# Patient Record
Sex: Female | Born: 1940 | Race: White | Hispanic: No | Smoking: Former smoker
Health system: Southern US, Community
[De-identification: ages and names within clinical notes are randomized; demographics above are authoritative.]

## PROBLEM LIST (undated history)

## (undated) DIAGNOSIS — H269 Unspecified cataract: Secondary | ICD-10-CM

## (undated) DIAGNOSIS — E079 Disorder of thyroid, unspecified: Secondary | ICD-10-CM

## (undated) DIAGNOSIS — T7840XA Allergy, unspecified, initial encounter: Secondary | ICD-10-CM

## (undated) DIAGNOSIS — N390 Urinary tract infection, site not specified: Secondary | ICD-10-CM

## (undated) DIAGNOSIS — M1990 Unspecified osteoarthritis, unspecified site: Secondary | ICD-10-CM

## (undated) DIAGNOSIS — I10 Essential (primary) hypertension: Secondary | ICD-10-CM

## (undated) DIAGNOSIS — K589 Irritable bowel syndrome without diarrhea: Secondary | ICD-10-CM

## (undated) DIAGNOSIS — H409 Unspecified glaucoma: Secondary | ICD-10-CM

## (undated) HISTORY — DX: Unspecified cataract: H26.9

## (undated) HISTORY — PX: OTHER SURGICAL HISTORY: SHX169

## (undated) HISTORY — DX: Urinary tract infection, site not specified: N39.0

## (undated) HISTORY — DX: Essential (primary) hypertension: I10

## (undated) HISTORY — DX: Disorder of thyroid, unspecified: E07.9

## (undated) HISTORY — DX: Unspecified osteoarthritis, unspecified site: M19.90

## (undated) HISTORY — DX: Unspecified glaucoma: H40.9

## (undated) HISTORY — DX: Allergy, unspecified, initial encounter: T78.40XA

## (undated) HISTORY — PX: TONSILLECTOMY AND ADENOIDECTOMY: SUR1326

## (undated) HISTORY — DX: Irritable bowel syndrome, unspecified: K58.9

---

## 1978-05-20 HISTORY — PX: HERNIA REPAIR: SHX51

## 1979-05-21 HISTORY — PX: APPENDECTOMY: SHX54

## 1979-05-21 HISTORY — PX: ABDOMINAL HYSTERECTOMY: SHX81

## 2015-06-10 DIAGNOSIS — R7989 Other specified abnormal findings of blood chemistry: Secondary | ICD-10-CM | POA: Diagnosis not present

## 2015-06-10 DIAGNOSIS — I10 Essential (primary) hypertension: Secondary | ICD-10-CM | POA: Diagnosis not present

## 2015-06-10 DIAGNOSIS — E034 Atrophy of thyroid (acquired): Secondary | ICD-10-CM | POA: Diagnosis not present

## 2015-06-10 DIAGNOSIS — K8689 Other specified diseases of pancreas: Secondary | ICD-10-CM | POA: Diagnosis not present

## 2015-06-10 DIAGNOSIS — I7300 Raynaud's syndrome without gangrene: Secondary | ICD-10-CM | POA: Diagnosis not present

## 2015-06-10 DIAGNOSIS — E7800 Pure hypercholesterolemia, unspecified: Secondary | ICD-10-CM | POA: Diagnosis not present

## 2015-06-16 DIAGNOSIS — L651 Anagen effluvium: Secondary | ICD-10-CM | POA: Diagnosis not present

## 2015-06-16 DIAGNOSIS — I7300 Raynaud's syndrome without gangrene: Secondary | ICD-10-CM | POA: Diagnosis not present

## 2015-06-16 DIAGNOSIS — J320 Chronic maxillary sinusitis: Secondary | ICD-10-CM | POA: Diagnosis not present

## 2015-06-16 DIAGNOSIS — E034 Atrophy of thyroid (acquired): Secondary | ICD-10-CM | POA: Diagnosis not present

## 2015-06-16 DIAGNOSIS — K8681 Exocrine pancreatic insufficiency: Secondary | ICD-10-CM | POA: Diagnosis not present

## 2015-06-16 DIAGNOSIS — I878 Other specified disorders of veins: Secondary | ICD-10-CM | POA: Diagnosis not present

## 2015-06-16 DIAGNOSIS — E7800 Pure hypercholesterolemia, unspecified: Secondary | ICD-10-CM | POA: Diagnosis not present

## 2015-06-16 DIAGNOSIS — I10 Essential (primary) hypertension: Secondary | ICD-10-CM | POA: Diagnosis not present

## 2015-06-16 DIAGNOSIS — R7989 Other specified abnormal findings of blood chemistry: Secondary | ICD-10-CM | POA: Diagnosis not present

## 2015-06-19 DIAGNOSIS — J320 Chronic maxillary sinusitis: Secondary | ICD-10-CM | POA: Diagnosis not present

## 2015-06-23 DIAGNOSIS — J3489 Other specified disorders of nose and nasal sinuses: Secondary | ICD-10-CM | POA: Diagnosis not present

## 2015-06-23 DIAGNOSIS — K089 Disorder of teeth and supporting structures, unspecified: Secondary | ICD-10-CM | POA: Diagnosis not present

## 2015-06-23 DIAGNOSIS — J320 Chronic maxillary sinusitis: Secondary | ICD-10-CM | POA: Diagnosis not present

## 2015-07-11 DIAGNOSIS — J320 Chronic maxillary sinusitis: Secondary | ICD-10-CM | POA: Diagnosis not present

## 2015-08-25 DIAGNOSIS — H401132 Primary open-angle glaucoma, bilateral, moderate stage: Secondary | ICD-10-CM | POA: Diagnosis not present

## 2015-09-04 DIAGNOSIS — M79672 Pain in left foot: Secondary | ICD-10-CM | POA: Diagnosis not present

## 2015-09-04 DIAGNOSIS — M79671 Pain in right foot: Secondary | ICD-10-CM | POA: Diagnosis not present

## 2015-09-04 DIAGNOSIS — L852 Keratosis punctata (palmaris et plantaris): Secondary | ICD-10-CM | POA: Diagnosis not present

## 2015-10-05 DIAGNOSIS — E034 Atrophy of thyroid (acquired): Secondary | ICD-10-CM | POA: Diagnosis not present

## 2015-10-05 DIAGNOSIS — L651 Anagen effluvium: Secondary | ICD-10-CM | POA: Diagnosis not present

## 2015-10-05 DIAGNOSIS — L659 Nonscarring hair loss, unspecified: Secondary | ICD-10-CM | POA: Diagnosis not present

## 2015-10-05 DIAGNOSIS — K8681 Exocrine pancreatic insufficiency: Secondary | ICD-10-CM | POA: Diagnosis not present

## 2015-10-05 DIAGNOSIS — I7300 Raynaud's syndrome without gangrene: Secondary | ICD-10-CM | POA: Diagnosis not present

## 2015-10-05 DIAGNOSIS — R682 Dry mouth, unspecified: Secondary | ICD-10-CM | POA: Diagnosis not present

## 2015-10-05 DIAGNOSIS — R768 Other specified abnormal immunological findings in serum: Secondary | ICD-10-CM | POA: Diagnosis not present

## 2015-10-05 DIAGNOSIS — E7800 Pure hypercholesterolemia, unspecified: Secondary | ICD-10-CM | POA: Diagnosis not present

## 2015-10-05 DIAGNOSIS — R7989 Other specified abnormal findings of blood chemistry: Secondary | ICD-10-CM | POA: Diagnosis not present

## 2015-10-05 DIAGNOSIS — I10 Essential (primary) hypertension: Secondary | ICD-10-CM | POA: Diagnosis not present

## 2015-10-05 DIAGNOSIS — I878 Other specified disorders of veins: Secondary | ICD-10-CM | POA: Diagnosis not present

## 2015-10-25 DIAGNOSIS — I7300 Raynaud's syndrome without gangrene: Secondary | ICD-10-CM | POA: Diagnosis not present

## 2015-10-25 DIAGNOSIS — E034 Atrophy of thyroid (acquired): Secondary | ICD-10-CM | POA: Diagnosis not present

## 2015-10-25 DIAGNOSIS — I10 Essential (primary) hypertension: Secondary | ICD-10-CM | POA: Diagnosis not present

## 2015-10-25 DIAGNOSIS — E7800 Pure hypercholesterolemia, unspecified: Secondary | ICD-10-CM | POA: Diagnosis not present

## 2015-10-25 DIAGNOSIS — L651 Anagen effluvium: Secondary | ICD-10-CM | POA: Diagnosis not present

## 2015-10-25 DIAGNOSIS — R682 Dry mouth, unspecified: Secondary | ICD-10-CM | POA: Diagnosis not present

## 2015-10-25 DIAGNOSIS — E559 Vitamin D deficiency, unspecified: Secondary | ICD-10-CM | POA: Diagnosis not present

## 2015-10-25 DIAGNOSIS — R768 Other specified abnormal immunological findings in serum: Secondary | ICD-10-CM | POA: Diagnosis not present

## 2015-12-01 DIAGNOSIS — H401132 Primary open-angle glaucoma, bilateral, moderate stage: Secondary | ICD-10-CM | POA: Diagnosis not present

## 2015-12-11 DIAGNOSIS — B373 Candidiasis of vulva and vagina: Secondary | ICD-10-CM | POA: Diagnosis not present

## 2015-12-11 DIAGNOSIS — N952 Postmenopausal atrophic vaginitis: Secondary | ICD-10-CM | POA: Diagnosis not present

## 2015-12-11 DIAGNOSIS — I10 Essential (primary) hypertension: Secondary | ICD-10-CM | POA: Diagnosis not present

## 2015-12-25 DIAGNOSIS — N952 Postmenopausal atrophic vaginitis: Secondary | ICD-10-CM | POA: Diagnosis not present

## 2015-12-25 DIAGNOSIS — M79671 Pain in right foot: Secondary | ICD-10-CM | POA: Diagnosis not present

## 2015-12-25 DIAGNOSIS — M79672 Pain in left foot: Secondary | ICD-10-CM | POA: Diagnosis not present

## 2015-12-25 DIAGNOSIS — L852 Keratosis punctata (palmaris et plantaris): Secondary | ICD-10-CM | POA: Diagnosis not present

## 2016-01-19 DIAGNOSIS — H401132 Primary open-angle glaucoma, bilateral, moderate stage: Secondary | ICD-10-CM | POA: Diagnosis not present

## 2016-01-19 DIAGNOSIS — H4322 Crystalline deposits in vitreous body, left eye: Secondary | ICD-10-CM | POA: Diagnosis not present

## 2016-02-27 DIAGNOSIS — E559 Vitamin D deficiency, unspecified: Secondary | ICD-10-CM | POA: Diagnosis not present

## 2016-02-27 DIAGNOSIS — R682 Dry mouth, unspecified: Secondary | ICD-10-CM | POA: Diagnosis not present

## 2016-02-27 DIAGNOSIS — E034 Atrophy of thyroid (acquired): Secondary | ICD-10-CM | POA: Diagnosis not present

## 2016-02-27 DIAGNOSIS — I10 Essential (primary) hypertension: Secondary | ICD-10-CM | POA: Diagnosis not present

## 2016-02-27 DIAGNOSIS — L651 Anagen effluvium: Secondary | ICD-10-CM | POA: Diagnosis not present

## 2016-02-27 DIAGNOSIS — E7800 Pure hypercholesterolemia, unspecified: Secondary | ICD-10-CM | POA: Diagnosis not present

## 2016-02-27 DIAGNOSIS — I7300 Raynaud's syndrome without gangrene: Secondary | ICD-10-CM | POA: Diagnosis not present

## 2016-02-27 DIAGNOSIS — R768 Other specified abnormal immunological findings in serum: Secondary | ICD-10-CM | POA: Diagnosis not present

## 2016-02-29 DIAGNOSIS — E559 Vitamin D deficiency, unspecified: Secondary | ICD-10-CM | POA: Diagnosis not present

## 2016-02-29 DIAGNOSIS — R768 Other specified abnormal immunological findings in serum: Secondary | ICD-10-CM | POA: Diagnosis not present

## 2016-02-29 DIAGNOSIS — I10 Essential (primary) hypertension: Secondary | ICD-10-CM | POA: Diagnosis not present

## 2016-02-29 DIAGNOSIS — K8681 Exocrine pancreatic insufficiency: Secondary | ICD-10-CM | POA: Diagnosis not present

## 2016-02-29 DIAGNOSIS — E7800 Pure hypercholesterolemia, unspecified: Secondary | ICD-10-CM | POA: Diagnosis not present

## 2016-02-29 DIAGNOSIS — M180 Bilateral primary osteoarthritis of first carpometacarpal joints: Secondary | ICD-10-CM | POA: Diagnosis not present

## 2016-02-29 DIAGNOSIS — E034 Atrophy of thyroid (acquired): Secondary | ICD-10-CM | POA: Diagnosis not present

## 2016-02-29 DIAGNOSIS — Z23 Encounter for immunization: Secondary | ICD-10-CM | POA: Diagnosis not present

## 2016-02-29 DIAGNOSIS — R682 Dry mouth, unspecified: Secondary | ICD-10-CM | POA: Diagnosis not present

## 2016-02-29 DIAGNOSIS — L659 Nonscarring hair loss, unspecified: Secondary | ICD-10-CM | POA: Diagnosis not present

## 2016-02-29 DIAGNOSIS — I7300 Raynaud's syndrome without gangrene: Secondary | ICD-10-CM | POA: Diagnosis not present

## 2016-02-29 DIAGNOSIS — I878 Other specified disorders of veins: Secondary | ICD-10-CM | POA: Diagnosis not present

## 2016-03-15 DIAGNOSIS — R8299 Other abnormal findings in urine: Secondary | ICD-10-CM | POA: Diagnosis not present

## 2016-03-15 DIAGNOSIS — S30824D Blister (nonthermal) of vagina and vulva, subsequent encounter: Secondary | ICD-10-CM | POA: Diagnosis not present

## 2016-03-21 DIAGNOSIS — Z1231 Encounter for screening mammogram for malignant neoplasm of breast: Secondary | ICD-10-CM | POA: Diagnosis not present

## 2016-05-24 DIAGNOSIS — H401132 Primary open-angle glaucoma, bilateral, moderate stage: Secondary | ICD-10-CM | POA: Diagnosis not present

## 2016-05-27 DIAGNOSIS — L03039 Cellulitis of unspecified toe: Secondary | ICD-10-CM | POA: Diagnosis not present

## 2016-05-27 DIAGNOSIS — L600 Ingrowing nail: Secondary | ICD-10-CM | POA: Diagnosis not present

## 2016-05-27 DIAGNOSIS — M79672 Pain in left foot: Secondary | ICD-10-CM | POA: Diagnosis not present

## 2016-05-27 DIAGNOSIS — M79671 Pain in right foot: Secondary | ICD-10-CM | POA: Diagnosis not present

## 2016-06-24 DIAGNOSIS — J0101 Acute recurrent maxillary sinusitis: Secondary | ICD-10-CM | POA: Diagnosis not present

## 2016-07-01 DIAGNOSIS — I878 Other specified disorders of veins: Secondary | ICD-10-CM | POA: Diagnosis not present

## 2016-07-01 DIAGNOSIS — R768 Other specified abnormal immunological findings in serum: Secondary | ICD-10-CM | POA: Diagnosis not present

## 2016-07-01 DIAGNOSIS — K8681 Exocrine pancreatic insufficiency: Secondary | ICD-10-CM | POA: Diagnosis not present

## 2016-07-01 DIAGNOSIS — I10 Essential (primary) hypertension: Secondary | ICD-10-CM | POA: Diagnosis not present

## 2016-07-01 DIAGNOSIS — L659 Nonscarring hair loss, unspecified: Secondary | ICD-10-CM | POA: Diagnosis not present

## 2016-07-01 DIAGNOSIS — M180 Bilateral primary osteoarthritis of first carpometacarpal joints: Secondary | ICD-10-CM | POA: Diagnosis not present

## 2016-07-01 DIAGNOSIS — E7800 Pure hypercholesterolemia, unspecified: Secondary | ICD-10-CM | POA: Diagnosis not present

## 2016-07-01 DIAGNOSIS — R682 Dry mouth, unspecified: Secondary | ICD-10-CM | POA: Diagnosis not present

## 2016-07-01 DIAGNOSIS — E034 Atrophy of thyroid (acquired): Secondary | ICD-10-CM | POA: Diagnosis not present

## 2016-07-01 DIAGNOSIS — I7300 Raynaud's syndrome without gangrene: Secondary | ICD-10-CM | POA: Diagnosis not present

## 2016-07-04 DIAGNOSIS — I10 Essential (primary) hypertension: Secondary | ICD-10-CM | POA: Diagnosis not present

## 2016-07-04 DIAGNOSIS — E034 Atrophy of thyroid (acquired): Secondary | ICD-10-CM | POA: Diagnosis not present

## 2016-07-04 DIAGNOSIS — L659 Nonscarring hair loss, unspecified: Secondary | ICD-10-CM | POA: Diagnosis not present

## 2016-07-04 DIAGNOSIS — M19049 Primary osteoarthritis, unspecified hand: Secondary | ICD-10-CM | POA: Diagnosis not present

## 2016-07-04 DIAGNOSIS — E559 Vitamin D deficiency, unspecified: Secondary | ICD-10-CM | POA: Diagnosis not present

## 2016-07-04 DIAGNOSIS — J301 Allergic rhinitis due to pollen: Secondary | ICD-10-CM | POA: Diagnosis not present

## 2016-07-04 DIAGNOSIS — R768 Other specified abnormal immunological findings in serum: Secondary | ICD-10-CM | POA: Diagnosis not present

## 2016-07-04 DIAGNOSIS — E7800 Pure hypercholesterolemia, unspecified: Secondary | ICD-10-CM | POA: Diagnosis not present

## 2016-07-04 DIAGNOSIS — I7300 Raynaud's syndrome without gangrene: Secondary | ICD-10-CM | POA: Diagnosis not present

## 2016-07-04 DIAGNOSIS — I878 Other specified disorders of veins: Secondary | ICD-10-CM | POA: Diagnosis not present

## 2016-07-11 DIAGNOSIS — J320 Chronic maxillary sinusitis: Secondary | ICD-10-CM | POA: Diagnosis not present

## 2016-07-24 DIAGNOSIS — L569 Acute skin change due to ultraviolet radiation, unspecified: Secondary | ICD-10-CM | POA: Diagnosis not present

## 2016-07-24 DIAGNOSIS — L570 Actinic keratosis: Secondary | ICD-10-CM | POA: Diagnosis not present

## 2016-07-24 DIAGNOSIS — L259 Unspecified contact dermatitis, unspecified cause: Secondary | ICD-10-CM | POA: Diagnosis not present

## 2016-08-08 DIAGNOSIS — J320 Chronic maxillary sinusitis: Secondary | ICD-10-CM | POA: Diagnosis not present

## 2016-09-20 DIAGNOSIS — H401132 Primary open-angle glaucoma, bilateral, moderate stage: Secondary | ICD-10-CM | POA: Diagnosis not present

## 2016-09-30 DIAGNOSIS — L03039 Cellulitis of unspecified toe: Secondary | ICD-10-CM | POA: Diagnosis not present

## 2016-09-30 DIAGNOSIS — L600 Ingrowing nail: Secondary | ICD-10-CM | POA: Diagnosis not present

## 2016-09-30 DIAGNOSIS — M79671 Pain in right foot: Secondary | ICD-10-CM | POA: Diagnosis not present

## 2016-09-30 DIAGNOSIS — M79672 Pain in left foot: Secondary | ICD-10-CM | POA: Diagnosis not present

## 2016-10-23 DIAGNOSIS — M47812 Spondylosis without myelopathy or radiculopathy, cervical region: Secondary | ICD-10-CM | POA: Diagnosis not present

## 2016-10-23 DIAGNOSIS — M25512 Pain in left shoulder: Secondary | ICD-10-CM | POA: Diagnosis not present

## 2016-10-23 DIAGNOSIS — M19011 Primary osteoarthritis, right shoulder: Secondary | ICD-10-CM | POA: Diagnosis not present

## 2016-10-23 DIAGNOSIS — M479 Spondylosis, unspecified: Secondary | ICD-10-CM | POA: Diagnosis not present

## 2016-10-23 DIAGNOSIS — R768 Other specified abnormal immunological findings in serum: Secondary | ICD-10-CM | POA: Diagnosis not present

## 2016-10-23 DIAGNOSIS — M5412 Radiculopathy, cervical region: Secondary | ICD-10-CM | POA: Diagnosis not present

## 2016-10-23 DIAGNOSIS — M2578 Osteophyte, vertebrae: Secondary | ICD-10-CM | POA: Diagnosis not present

## 2016-10-23 DIAGNOSIS — M25511 Pain in right shoulder: Secondary | ICD-10-CM | POA: Diagnosis not present

## 2016-10-23 DIAGNOSIS — M19012 Primary osteoarthritis, left shoulder: Secondary | ICD-10-CM | POA: Diagnosis not present

## 2016-10-28 DIAGNOSIS — J301 Allergic rhinitis due to pollen: Secondary | ICD-10-CM | POA: Diagnosis not present

## 2016-10-28 DIAGNOSIS — L659 Nonscarring hair loss, unspecified: Secondary | ICD-10-CM | POA: Diagnosis not present

## 2016-10-28 DIAGNOSIS — I7300 Raynaud's syndrome without gangrene: Secondary | ICD-10-CM | POA: Diagnosis not present

## 2016-10-28 DIAGNOSIS — I878 Other specified disorders of veins: Secondary | ICD-10-CM | POA: Diagnosis not present

## 2016-10-28 DIAGNOSIS — E7800 Pure hypercholesterolemia, unspecified: Secondary | ICD-10-CM | POA: Diagnosis not present

## 2016-10-28 DIAGNOSIS — E559 Vitamin D deficiency, unspecified: Secondary | ICD-10-CM | POA: Diagnosis not present

## 2016-10-28 DIAGNOSIS — I10 Essential (primary) hypertension: Secondary | ICD-10-CM | POA: Diagnosis not present

## 2016-10-28 DIAGNOSIS — E034 Atrophy of thyroid (acquired): Secondary | ICD-10-CM | POA: Diagnosis not present

## 2016-10-28 DIAGNOSIS — R768 Other specified abnormal immunological findings in serum: Secondary | ICD-10-CM | POA: Diagnosis not present

## 2016-10-28 DIAGNOSIS — M19049 Primary osteoarthritis, unspecified hand: Secondary | ICD-10-CM | POA: Diagnosis not present

## 2016-10-31 DIAGNOSIS — I10 Essential (primary) hypertension: Secondary | ICD-10-CM | POA: Diagnosis not present

## 2016-10-31 DIAGNOSIS — L659 Nonscarring hair loss, unspecified: Secondary | ICD-10-CM | POA: Diagnosis not present

## 2016-10-31 DIAGNOSIS — E034 Atrophy of thyroid (acquired): Secondary | ICD-10-CM | POA: Diagnosis not present

## 2016-10-31 DIAGNOSIS — M1812 Unilateral primary osteoarthritis of first carpometacarpal joint, left hand: Secondary | ICD-10-CM | POA: Diagnosis not present

## 2016-10-31 DIAGNOSIS — M5412 Radiculopathy, cervical region: Secondary | ICD-10-CM | POA: Diagnosis not present

## 2016-10-31 DIAGNOSIS — E559 Vitamin D deficiency, unspecified: Secondary | ICD-10-CM | POA: Diagnosis not present

## 2016-10-31 DIAGNOSIS — M19049 Primary osteoarthritis, unspecified hand: Secondary | ICD-10-CM | POA: Diagnosis not present

## 2016-10-31 DIAGNOSIS — E7800 Pure hypercholesterolemia, unspecified: Secondary | ICD-10-CM | POA: Diagnosis not present

## 2016-10-31 DIAGNOSIS — R768 Other specified abnormal immunological findings in serum: Secondary | ICD-10-CM | POA: Diagnosis not present

## 2016-10-31 DIAGNOSIS — I7300 Raynaud's syndrome without gangrene: Secondary | ICD-10-CM | POA: Diagnosis not present

## 2016-10-31 DIAGNOSIS — M7541 Impingement syndrome of right shoulder: Secondary | ICD-10-CM | POA: Diagnosis not present

## 2016-10-31 DIAGNOSIS — M1811 Unilateral primary osteoarthritis of first carpometacarpal joint, right hand: Secondary | ICD-10-CM | POA: Diagnosis not present

## 2016-11-06 DIAGNOSIS — M5412 Radiculopathy, cervical region: Secondary | ICD-10-CM | POA: Diagnosis not present

## 2016-11-07 DIAGNOSIS — M180 Bilateral primary osteoarthritis of first carpometacarpal joints: Secondary | ICD-10-CM | POA: Diagnosis not present

## 2016-11-07 DIAGNOSIS — M5412 Radiculopathy, cervical region: Secondary | ICD-10-CM | POA: Diagnosis not present

## 2016-11-07 DIAGNOSIS — M25511 Pain in right shoulder: Secondary | ICD-10-CM | POA: Diagnosis not present

## 2016-11-08 DIAGNOSIS — H401132 Primary open-angle glaucoma, bilateral, moderate stage: Secondary | ICD-10-CM | POA: Diagnosis not present

## 2016-11-26 DIAGNOSIS — L600 Ingrowing nail: Secondary | ICD-10-CM | POA: Diagnosis not present

## 2016-11-26 DIAGNOSIS — L03039 Cellulitis of unspecified toe: Secondary | ICD-10-CM | POA: Diagnosis not present

## 2016-11-26 DIAGNOSIS — M79672 Pain in left foot: Secondary | ICD-10-CM | POA: Diagnosis not present

## 2016-11-26 DIAGNOSIS — M79671 Pain in right foot: Secondary | ICD-10-CM | POA: Diagnosis not present

## 2016-12-31 ENCOUNTER — Encounter: Payer: Self-pay | Admitting: Internal Medicine

## 2016-12-31 ENCOUNTER — Ambulatory Visit (INDEPENDENT_AMBULATORY_CARE_PROVIDER_SITE_OTHER): Payer: Medicare Other | Admitting: Internal Medicine

## 2016-12-31 DIAGNOSIS — M159 Polyosteoarthritis, unspecified: Secondary | ICD-10-CM

## 2016-12-31 DIAGNOSIS — H409 Unspecified glaucoma: Secondary | ICD-10-CM | POA: Diagnosis not present

## 2016-12-31 DIAGNOSIS — I10 Essential (primary) hypertension: Secondary | ICD-10-CM | POA: Diagnosis not present

## 2016-12-31 DIAGNOSIS — E039 Hypothyroidism, unspecified: Secondary | ICD-10-CM | POA: Diagnosis not present

## 2016-12-31 DIAGNOSIS — L659 Nonscarring hair loss, unspecified: Secondary | ICD-10-CM | POA: Diagnosis not present

## 2016-12-31 DIAGNOSIS — M150 Primary generalized (osteo)arthritis: Secondary | ICD-10-CM | POA: Diagnosis not present

## 2016-12-31 MED ORDER — SPIRONOLACTONE 25 MG PO TABS: 50.0000 mg | ORAL_TABLET | Freq: Two times a day (BID) | ORAL | 0 refills | Status: DC

## 2016-12-31 NOTE — Progress Notes (Signed)
HPI  Pt presents to the clinic today to establish care and for management of the conditions listed below. She is [place] care from [facility] [place].  Arthritis: She reports this is all over but worse in her shoulders and her hands. She recently had a steroid injection in her left shoulder. She takes Tylenol Arthritis as needed with good relief.  Glaucoma: She will be establishing with an eye doctor in September. She takes Timolol as prescribed.  Hair Loss: Controlled with Spironolactone. She is requesting a refill of this today.  HTN: Her BP today is 124/78. She is taking Amlodipine as prescribed. She denies swelling in her lower extremities. There is no ECG on file.  Hypothyroidism: She denies any issues on her current dose of Synthroid.   Flu: 01/2016 Tetanus: unsure Pneumovax: unsure Prevnar: unsure Zostovax: unsure Shingrix: never Mammogram: 02/2016 Pap Smear: Total Hysterectomy Bone Density: unsure Colon Screening: < 10 years ago Vision Screening: every 4 months Dentist: annually   Past Medical History:  Diagnosis Date  . Arthritis   . Glaucoma   . Hypertension   . Thyroid disease     Current Outpatient Prescriptions  Medication Sig Dispense Refill  . amLODipine (NORVASC) 10 MG tablet Take 10 mg by mouth daily.  0  . clobetasol cream (TEMOVATE) 0.05 % Apply 1 application topically 2 (two) times daily.    . fluticasone (FLONASE) 50 MCG/ACT nasal spray Place 2 sprays into both nostrils daily.    Marland Kitchen. latanoprost (XALATAN) 0.005 % ophthalmic solution Place 1 drop into both eyes at bedtime.    Marland Kitchen. levothyroxine (SYNTHROID, LEVOTHROID) 125 MCG tablet Take 62.5 mcg by mouth daily before breakfast.     . spironolactone (ALDACTONE) 25 MG tablet Take 50 mg by mouth 2 (two) times daily.    . timolol (TIMOPTIC) 0.5 % ophthalmic solution INSTILL 1 DROP INTO BOTH EYES EVERY DAY  6  . Vitamin D, Ergocalciferol, (DRISDOL) 50000 units CAPS capsule Take 50,000 Units by  mouth every 7 (seven) days.     No current facility-administered medications for this visit.     Not on File  Family History  Problem Relation Age of Onset  . Arthritis Mother   . Breast cancer Mother   . Stroke Mother   . Hypertension Mother   . Hypertension Father   . Diabetes Daughter   . Diabetes Son   . Arthritis Maternal Grandmother   . Arthritis Maternal Grandfather     Social History   Social History  . Marital status: Single    Spouse name: N/A  . Number of children: N/A  . Years of education: N/A   Occupational History  . Not on file.   Social History Main Topics  . Smoking status: Former Games developermoker  . Smokeless tobacco: Never Used     Comment: quit 25 years ago  . Alcohol use 4.2 oz/week    7 Glasses of wine per week     Comment: with dinner  . Drug use: Unknown  . Sexual activity: Not on file   Other Topics Concern  . Not on file   Social History Narrative  . No narrative on file    ROS:  Constitutional: Denies fever, malaise, fatigue, headache or abrupt weight changes.  HEENT: Denies eye pain, eye redness, ear pain, ringing in the ears, wax buildup, runny nose, nasal congestion, bloody nose, or sore throat. Respiratory: Denies difficulty breathing, shortness of breath, cough or sputum production.   Cardiovascular: Denies chest  pain, chest tightness, palpitations or swelling in the hands or feet.  Gastrointestinal: Denies abdominal pain, bloating, constipation, diarrhea or blood in the stool.  GU: Denies frequency, urgency, pain with urination, blood in urine, odor or discharge. Musculoskeletal: Pt reports joint pains. Denies decrease in range of motion, difficulty with gait, muscle pain or joint swelling.  Skin: Denies redness, rashes, lesions or ulcercations.  Neurological: Denies dizziness, difficulty with memory, difficulty with speech or problems with balance and coordination.  Psych: Denies anxiety, depression, SI/HI.  No other specific  complaints in a complete review of systems (except as listed in HPI above).  PE:  BP 124/78   Pulse 63   Temp 98.1 F (36.7 C) (Oral)   Ht 4' 11.25" (1.505 m)   Wt 119 lb (54 kg)   SpO2 96%   BMI 23.83 kg/m  Wt Readings from Last 3 Encounters:  12/31/16 119 lb (54 kg)    General: Appears her stated age, well developed, well nourished in NAD. Skin: Dry and intact. Neck: Neck supple, trachea midline. No masses, lumps present.  Cardiovascular: Normal rate and rhythm.  Pulmonary/Chest: Normal effort and positive vesicular breath sounds. No respiratory distress. No wheezes, rales or ronchi noted.  Musculoskeletal: Joint deformity noted in hands. No difficulty with gait.  Neurological: Alert and oriented.  Psychiatric: Mood and affect normal. Behavior is normal. Judgment and thought content normal.     Assessment and Plan:

## 2017-01-01 DIAGNOSIS — E039 Hypothyroidism, unspecified: Secondary | ICD-10-CM | POA: Insufficient documentation

## 2017-01-01 DIAGNOSIS — I10 Essential (primary) hypertension: Secondary | ICD-10-CM | POA: Insufficient documentation

## 2017-01-01 DIAGNOSIS — L659 Nonscarring hair loss, unspecified: Secondary | ICD-10-CM | POA: Insufficient documentation

## 2017-01-01 DIAGNOSIS — M1990 Unspecified osteoarthritis, unspecified site: Secondary | ICD-10-CM | POA: Insufficient documentation

## 2017-01-01 DIAGNOSIS — H409 Unspecified glaucoma: Secondary | ICD-10-CM | POA: Insufficient documentation

## 2017-01-01 NOTE — Assessment & Plan Note (Signed)
She will continue Tylenol Arthritis for now She will let me know if she wants a referral to ortho in this area

## 2017-01-01 NOTE — Assessment & Plan Note (Signed)
Continue Timolol She is establishing with opthalmology in 1 month.

## 2017-01-01 NOTE — Patient Instructions (Signed)
Alopecia Areata, Adult  Alopecia areata is a condition that causes you to lose hair. You may lose hair on your scalp in patches. In some cases, you may lose all the hair on your scalp (alopecia totalis) or all the hair from your face and body (alopecia universalis).  Alopecia areata is an autoimmune disease. This means that your body's defense system (immune system) mistakes normal parts of the body for germs or other things that can make you sick. When you have alopecia areata, the immune system attacks the hair follicles.  Alopecia areata usually develops in childhood, but it can develop at any age. For some people, their hair grows back on its own and hair loss does not happen again. For others, their hair may fall out and grow back in cycles. The hair loss may last many years. Having this condition can be emotionally difficult, but it is not dangerous.  What are the causes?  The cause of this condition is not known.  What increases the risk?  This condition is more likely to develop in people who have:   A family history of alopecia.   A family history of another autoimmune disease, including type 1 diabetes and rheumatoid arthritis.   Asthma and allergies.   Down syndrome.    What are the signs or symptoms?  Round spots of patchy hair loss on the scalp is the main symptom of this condition. The spots may be mildly itchy. Other symptoms include:   Short dark hairs in the bald patches that are wider at the top (exclamation point hairs).   Dents, white spots, or lines in the fingernails or toenails.   Balding and body hair loss. This is rare.    How is this diagnosed?  This condition is diagnosed based on your symptoms and family history. Your health care provider will also check your scalp skin, teeth, and nails. Your health care provider may refer you to a specialist in hair and skin disorders (dermatologist). You may also have tests, including:   A hair pull test.   Blood tests or other screening tests  to check for autoimmune diseases, such as thyroid disease or diabetes.   Skin biopsy to confirm the diagnosis.   A procedure to examine the skin with a lighted magnifying instrument (dermoscopy).    How is this treated?  There is no cure for alopecia areata. Treatment is aimed at promoting the regrowth of hair and preventing the immune system from overreacting. No single treatment is right for all people with alopecia areata. It depends on the type of hair loss you have and how severe it is. Work with your health care provider to find the best treatment for you. Treatment may include:   Having regular checkups to make sure the condition is not getting worse (watchful waiting).   Steroid creams or pills for 6-8 weeks to stop the immune reaction and help hair to regrow more quickly.   Other topical medicines to alter the immune system response and support the hair growth cycle.   Steroid injections.   Therapy and counseling with a support group or therapist if you are having trouble coping with hair loss.    Follow these instructions at home:   Learn as much as you can about your condition.   Apply topical creams only as told by your health care provider.   Take over-the-counter and prescription medicines only as told by your health care provider.   Consider getting a wig or   products to make hair look fuller or to cover bald spots, if you feel uncomfortable with your appearance.   Get therapy or counseling if you are having a hard time coping with hair loss. Ask your health care provider to recommend a counselor or support group.   Keep all follow-up visits as told by your health care provider. This is important.  Contact a health care provider if:   Your hair loss gets worse, even with treatment.   You have new symptoms.   You are struggling emotionally.  Summary   Alopecia areata is an autoimmune condition that makes your body's defense system (immune system) attack the hair follicles. This causes  you to lose hair.   Treatments may include regular checkups to make sure that the condition is not getting worse (watchful waiting), medicines, and steroid injections.  This information is not intended to replace advice given to you by your health care provider. Make sure you discuss any questions you have with your health care provider.  Document Released: 12/09/2003 Document Revised: 05/24/2016 Document Reviewed: 05/24/2016  Elsevier Interactive Patient Education  2018 [employer] Inc[employer]

## 2017-01-01 NOTE — Assessment & Plan Note (Signed)
Continue current dose of Synthroid at this time Will check TSH and Free T4 at next visit

## 2017-01-01 NOTE — Assessment & Plan Note (Signed)
Controlled on Amlodipine Will monitor 

## 2017-01-01 NOTE — Assessment & Plan Note (Signed)
Spironolactone refilled today.

## 2017-02-05 ENCOUNTER — Other Ambulatory Visit: Payer: Self-pay

## 2017-02-05 DIAGNOSIS — E559 Vitamin D deficiency, unspecified: Secondary | ICD-10-CM

## 2017-02-05 NOTE — Telephone Encounter (Signed)
Pt left v/m requesting refill vit D to [facility] [address]. Pt established care on 12/31/16. Pamala Hurry NP has not filled vit D before. Pt thought Vit D was refilled on 12/31/16 while pt was at office.Please advise. Pt request cb.

## 2017-02-06 NOTE — Telephone Encounter (Signed)
She needs to have her Vit D checked, please order lab and have her make a lab only appt

## 2017-02-07 NOTE — Addendum Note (Signed)
Addended by: Roena Malady on: 02/07/2017 01:26 PM   Modules accepted: Orders

## 2017-02-07 NOTE — Telephone Encounter (Signed)
Pt has a lab only appt scheduled for Mon and lab has been future ordered

## 2017-02-10 ENCOUNTER — Other Ambulatory Visit (INDEPENDENT_AMBULATORY_CARE_PROVIDER_SITE_OTHER): Payer: Medicare Other

## 2017-02-10 DIAGNOSIS — E559 Vitamin D deficiency, unspecified: Secondary | ICD-10-CM | POA: Diagnosis not present

## 2017-02-10 LAB — VITAMIN D 25 HYDROXY (VIT D DEFICIENCY, FRACTURES): VITD: 42.93 ng/mL (ref 30.00–100.00)

## 2017-03-05 ENCOUNTER — Other Ambulatory Visit: Payer: Self-pay | Admitting: Internal Medicine

## 2017-03-05 MED ORDER — AMLODIPINE BESYLATE 10 MG PO TABS: 10.0000 mg | ORAL_TABLET | Freq: Every day | ORAL | 0 refills | Status: DC

## 2017-03-06 DIAGNOSIS — H401134 Primary open-angle glaucoma, bilateral, indeterminate stage: Secondary | ICD-10-CM | POA: Diagnosis not present

## 2017-03-06 DIAGNOSIS — Z9841 Cataract extraction status, right eye: Secondary | ICD-10-CM | POA: Diagnosis not present

## 2017-03-06 DIAGNOSIS — Z9842 Cataract extraction status, left eye: Secondary | ICD-10-CM | POA: Diagnosis not present

## 2017-03-06 DIAGNOSIS — H353132 Nonexudative age-related macular degeneration, bilateral, intermediate dry stage: Secondary | ICD-10-CM | POA: Diagnosis not present

## 2017-03-13 DIAGNOSIS — Z23 Encounter for immunization: Secondary | ICD-10-CM | POA: Diagnosis not present

## 2017-04-03 ENCOUNTER — Other Ambulatory Visit: Payer: Self-pay | Admitting: Internal Medicine

## 2017-04-03 DIAGNOSIS — Z139 Encounter for screening, unspecified: Secondary | ICD-10-CM

## 2017-04-23 ENCOUNTER — Encounter: Payer: Self-pay | Admitting: Podiatry

## 2017-04-23 ENCOUNTER — Ambulatory Visit: Payer: Medicare Other

## 2017-04-23 ENCOUNTER — Ambulatory Visit (INDEPENDENT_AMBULATORY_CARE_PROVIDER_SITE_OTHER): Payer: Medicare Other | Admitting: Podiatry

## 2017-04-23 DIAGNOSIS — M722 Plantar fascial fibromatosis: Secondary | ICD-10-CM | POA: Diagnosis not present

## 2017-04-23 NOTE — Progress Notes (Signed)
   Subjective:    Patient ID: Kara Green, female    DOB: Sep 03, 1940, 76 y.o.   MRN: [id number]  HPI   Chief Complaint  Patient presents with  . Foot Problem    i feel like i am walking on a pillow on my left heel   76 y.o. female presents with the above complaint.  States that she feels she is walking on a pillow on her left heel.  Past Medical History:  Diagnosis Date  . Arthritis   . Glaucoma   . Hypertension   . Thyroid disease    Past Surgical History:  Procedure Laterality Date  . ABDOMINAL HYSTERECTOMY  1981   total  . TONSILLECTOMY AND ADENOIDECTOMY      Current Outpatient Medications:  .  amLODipine (NORVASC) 10 MG tablet, Take 1 tablet (10 mg total) by mouth daily., Disp: 90 tablet, Rfl: 0 .  fluticasone (FLONASE) 50 MCG/ACT nasal spray, Place 2 sprays into both nostrils daily., Disp: , Rfl:  .  latanoprost (XALATAN) 0.005 % ophthalmic solution, Place 1 drop into both eyes at bedtime., Disp: , Rfl:  .  levothyroxine (SYNTHROID, LEVOTHROID) 125 MCG tablet, Take 62.5 mcg by mouth daily before breakfast. , Disp: , Rfl:  .  spironolactone (ALDACTONE) 25 MG tablet, Take 2 tablets (50 mg total) by mouth daily., Disp: 180 tablet, Rfl: 0 .  timolol (TIMOPTIC) 0.5 % ophthalmic solution, INSTILL 1 DROP INTO BOTH EYES EVERY DAY, Disp: , Rfl: 6 .  Vitamin D, Ergocalciferol, (DRISDOL) 50000 units CAPS capsule, Take 50,000 Units by mouth every 7 (seven) days., Disp: , Rfl:   Allergies  Allergen Reactions  . Benadryl [Diphenhydramine Hcl]   . Codeine   . Neurontin [Gabapentin]   . Penicillins   . Sulfa Antibiotics   . Tetracyclines & Related     Review of Systems  All other systems reviewed and are negative.      Objective:   Physical Exam There were no vitals filed for this visit. General AA&O x3. Normal mood and affect.  Vascular Dorsalis pedis and posterior tibial pulses  present 2+ bilaterally  Capillary refill normal to all digits. Pedal hair growth  normal.  Neurologic Epicritic sensation grossly present.  Dermatologic No open lesions. Interspaces clear of maceration. Nails well groomed and normal in appearance.  Orthopedic: MMT 5/5 in dorsiflexion, plantarflexion, inversion, and eversion. Normal joint ROM without pain or crepitus.      Assessment & Plan:  Patient was evaluated and treated all questions answered  Plantar Fasciitis, L due to fat pad atrophy -Educated on stretching and icing of the affected foot -Declined x-rays today

## 2017-05-15 ENCOUNTER — Ambulatory Visit
Admission: RE | Admit: 2017-05-15 | Discharge: 2017-05-15 | Disposition: A | Discharge status: Home / Self Care | Payer: Medicare Other | Source: Ambulatory Visit | Attending: Internal Medicine | Admitting: Internal Medicine

## 2017-05-15 DIAGNOSIS — Z139 Encounter for screening, unspecified: Secondary | ICD-10-CM

## 2017-05-15 DIAGNOSIS — Z1231 Encounter for screening mammogram for malignant neoplasm of breast: Secondary | ICD-10-CM | POA: Diagnosis not present

## 2017-05-22 DIAGNOSIS — R05 Cough: Secondary | ICD-10-CM | POA: Diagnosis not present

## 2017-05-22 DIAGNOSIS — J019 Acute sinusitis, unspecified: Secondary | ICD-10-CM | POA: Diagnosis not present

## 2017-05-22 DIAGNOSIS — R509 Fever, unspecified: Secondary | ICD-10-CM | POA: Diagnosis not present

## 2017-06-09 ENCOUNTER — Other Ambulatory Visit: Payer: Self-pay | Admitting: Internal Medicine

## 2017-06-27 MED ORDER — SPIRONOLACTONE 25 MG PO TABS: 50.0000 mg | ORAL_TABLET | Freq: Every day | ORAL | 0 refills | Status: DC

## 2017-06-27 NOTE — Addendum Note (Signed)
Addended by: Roena MaladyEVONTENNO, Suan Pyeatt Y on: 06/27/2017 10:17 AM   Modules accepted: Orders

## 2017-07-02 DIAGNOSIS — H40033 Anatomical narrow angle, bilateral: Secondary | ICD-10-CM | POA: Diagnosis not present

## 2017-07-02 DIAGNOSIS — H401131 Primary open-angle glaucoma, bilateral, mild stage: Secondary | ICD-10-CM | POA: Diagnosis not present

## 2017-07-02 DIAGNOSIS — H40053 Ocular hypertension, bilateral: Secondary | ICD-10-CM | POA: Diagnosis not present

## 2017-07-02 DIAGNOSIS — H353132 Nonexudative age-related macular degeneration, bilateral, intermediate dry stage: Secondary | ICD-10-CM | POA: Diagnosis not present

## 2017-07-02 DIAGNOSIS — H40023 Open angle with borderline findings, high risk, bilateral: Secondary | ICD-10-CM | POA: Diagnosis not present

## 2017-07-22 ENCOUNTER — Other Ambulatory Visit: Payer: Self-pay | Admitting: Internal Medicine

## 2017-07-22 NOTE — Telephone Encounter (Signed)
Copied from CRM [phone]. Topic: Quick Communication - Rx Refill/Question >> Jul 22, 2017 12:00 PM Landry MellowFoltz, Melissa J wrote: Medication: levothyroxine (SYNTHROID, LEVOTHROID) 125 MCG tablet   Has the patient contacted their pharmacy? Yes.  Pharm faxed provider, no answer    (Agent: If no, request that the patient contact the pharmacy for the refill.)   [facility]erred [facility] (with phone number [facility] street [facility] [address]    Agent: Please be advised that RX refills may take up to 3 business days. We ask that you follow-up with your pharmacy.

## 2017-07-23 ENCOUNTER — Other Ambulatory Visit: Payer: Self-pay

## 2017-07-23 MED ORDER — LEVOTHYROXINE SODIUM 125 MCG PO TABS: 62.5000 ug | ORAL_TABLET | Freq: Every day | ORAL | 0 refills | Status: DC

## 2017-07-23 NOTE — Telephone Encounter (Signed)
Patient is requesting refill of historical medication- levothyroxine 125 mcg  LOV: 12/31/16 establish care( note to continue until next visit- when will check labs- visit scheduled 3/20)  PCP: Baity  Pharmacy: verified

## 2017-08-06 ENCOUNTER — Telehealth: Payer: Self-pay

## 2017-08-06 ENCOUNTER — Encounter: Payer: Self-pay | Admitting: Internal Medicine

## 2017-08-06 ENCOUNTER — Ambulatory Visit (INDEPENDENT_AMBULATORY_CARE_PROVIDER_SITE_OTHER): Payer: Medicare Other | Admitting: Internal Medicine

## 2017-08-06 VITALS — BP 120/70 | HR 71 | Temp 98.1°F | Ht 59.25 in | Wt 121.0 lb

## 2017-08-06 DIAGNOSIS — R140 Abdominal distension (gaseous): Secondary | ICD-10-CM

## 2017-08-06 DIAGNOSIS — R195 Other fecal abnormalities: Secondary | ICD-10-CM | POA: Diagnosis not present

## 2017-08-06 DIAGNOSIS — H409 Unspecified glaucoma: Secondary | ICD-10-CM | POA: Diagnosis not present

## 2017-08-06 DIAGNOSIS — E039 Hypothyroidism, unspecified: Secondary | ICD-10-CM

## 2017-08-06 DIAGNOSIS — E559 Vitamin D deficiency, unspecified: Secondary | ICD-10-CM | POA: Diagnosis not present

## 2017-08-06 DIAGNOSIS — H3530 Unspecified macular degeneration: Secondary | ICD-10-CM

## 2017-08-06 DIAGNOSIS — Z0000 Encounter for general adult medical examination without abnormal findings: Secondary | ICD-10-CM

## 2017-08-06 LAB — CBC
HCT: 44.5 % (ref 36.0–46.0)
Hemoglobin: 14.7 g/dL (ref 12.0–15.0)
MCHC: 33 g/dL (ref 30.0–36.0)
MCV: 100 fl (ref 78.0–100.0)
PLATELETS: 215 10*3/uL (ref 150.0–400.0)
RBC: 4.45 Mil/uL (ref 3.87–5.11)
RDW: 13.7 % (ref 11.5–15.5)
WBC: 4.7 10*3/uL (ref 4.0–10.5)

## 2017-08-06 LAB — VITAMIN D 25 HYDROXY (VIT D DEFICIENCY, FRACTURES): VITD: 43.73 ng/mL (ref 30.00–100.00)

## 2017-08-06 LAB — TSH: TSH: 1.25 u[IU]/mL (ref 0.35–4.50)

## 2017-08-06 LAB — T4, FREE: FREE T4: 1.13 ng/dL (ref 0.60–1.60)

## 2017-08-06 NOTE — Progress Notes (Addendum)
HPI:  Pt presents to the clinic today for her Medicare Wellness Exam.  She would like to have her thyroid levels checked today. She denies any issues on her current dose of Synthroid.  She would like a referral for ophthalmology. [facility], but she needs someone that specializes in glaucoma and macular degeneration.  She also wants a referral for an allergist. She gets bloating, gas and diarrhea after consuming milk products. She is concerned about a lactose allergy.  Past Medical History:  Diagnosis Date  . Arthritis   . Glaucoma   . Hypertension   . Thyroid disease     Current Outpatient Medications  Medication Sig Dispense Refill  . amLODipine (NORVASC) 10 MG tablet Take 1 tablet (10 mg total) by mouth daily. MUST SCHEDULE ANNUAL EXAM 90 tablet 0  . Cholecalciferol (VITAMIN D) 2000 units CAPS Take 1 capsule by mouth daily.    . fluticasone (FLONASE) 50 MCG/ACT nasal spray Place 2 sprays into both nostrils daily.    Marland Kitchen. latanoprost (XALATAN) 0.005 % ophthalmic solution Place 1 drop into both eyes at bedtime.    Marland Kitchen. levothyroxine (SYNTHROID, LEVOTHROID) 125 MCG tablet Take 0.5 tablets (62.5 mcg total) by mouth daily before breakfast. 30 tablet 0  . spironolactone (ALDACTONE) 25 MG tablet Take 2 tablets (50 mg total) by mouth daily. 180 tablet 0  . timolol (TIMOPTIC) 0.5 % ophthalmic solution INSTILL 1 DROP INTO BOTH EYES EVERY DAY  6   No current facility-administered medications for this visit.     Allergies  Allergen Reactions  . Benadryl [Diphenhydramine Hcl]   . Codeine   . Neurontin [Gabapentin]   . Penicillins   . Sulfa Antibiotics   . Tetracyclines & Related     Family History  Problem Relation Age of Onset  . Arthritis Mother   . Breast cancer Mother        in 2980's  . Stroke Mother   . Hypertension Mother   . Hypertension Father   . Diabetes Daughter   . Diabetes Son   . Arthritis Maternal Grandmother   . Arthritis Maternal Grandfather      Social History   Socioeconomic History  . Marital status: Single    Spouse name: Not on file  . Number of children: Not on file  . Years of education: Not on file  . Highest education level: Not on file  Social Needs  . Financial resource strain: Not on file  . Food insecurity - worry: Not on file  . Food insecurity - inability: Not on file  . Transportation needs - medical: Not on file  . Transportation needs - non-medical: Not on file  Occupational History  . Not on file  Tobacco Use  . Smoking status: Former Games developermoker  . Smokeless tobacco: Never Used  . Tobacco comment: quit 25 years ago  Substance and Sexual Activity  . Alcohol use: Yes    Alcohol/week: 4.2 oz    Types: 7 Glasses of wine per week    Comment: with dinner  . Drug use: No  . Sexual activity: Not Currently  Other Topics Concern  . Not on file  Social History Narrative  . Not on file    Hospitiliaztions: None  Health Maintenance:    Flu: 02/2017  Tetanus: ? 2016  Pneumovax: had them, but can't remember when  Prevnar: had them, but can't remember when  Zostavax: had them, but can't remember when  Mammogram: 04/2017  Pap Smear: no  longer screening  Bone Density: more than 5 years  Colon Screening: < 10 years ago, but no longer screening  Eye Doctor: as needed  Dental Exam: as needed   Providers:   PCP: Nicki Reaper, NP-C  Podiatrist: Dr. Jeanella Craze   I have personally reviewed and have noted:  1. The patient's medical and social history 2. Their use of alcohol, tobacco or illicit drugs 3. Their current medications and supplements 4. The patient's functional ability including ADL's, fall risks, home safety risks and hearing or visual impairment. 5. Diet and physical activities 6. Evidence for depression or mood disorder  Subjective:   Review of Systems:   Constitutional: Denies fever, malaise, fatigue, headache or abrupt weight changes.  HEENT: Pt reports blurred vision. Denies eye  pain, eye redness, ear pain, ringing in the ears, wax buildup, runny nose, nasal congestion, bloody nose, or sore throat. Respiratory: Denies difficulty breathing, shortness of breath, cough or sputum production.   Cardiovascular: Denies chest pain, chest tightness, palpitations or swelling in the hands or feet.  Gastrointestinal: Pt reports gas, bloating and loose stools after consuming milk. Denies abdominal pain, bloating, constipation, diarrhea or blood in the stool.  GU: Denies urgency, frequency, pain with urination, burning sensation, blood in urine, odor or discharge. Musculoskeletal: Pt reports intermittent aches and pains, nothing significant. Denies decrease in range of motion, difficulty with gait, muscle pain or joint pain and swelling.  Skin: Denies redness, rashes, lesions or ulcercations.  Neurological: Denies dizziness, difficulty with memory, difficulty with speech or problems with balance and coordination.  Psych: Denies anxiety, depression, SI/HI.  No other specific complaints in a complete review of systems (except as listed in HPI above).  Objective:  PE:   BP 120/70   Pulse 71   Temp 98.1 F (36.7 C) (Oral)   Ht 4' 11.25" (1.505 m)   Wt 121 lb (54.9 kg)   SpO2 99%   BMI 24.23 kg/m   Wt Readings from Last 3 Encounters:  08/06/17 121 lb (54.9 kg)  12/31/16 119 lb (54 kg)    General: Appears her stated age, well developed, well nourished in NAD. Skin: Warm, dry and intact.  Neck: Neck supple, trachea midline. No masses, lumps present.  Cardiovascular: Normal rate and rhythm. S1,S2 noted.  No murmur, rubs or gallops noted. No JVD or BLE edema. No carotid bruits noted. Pulmonary/Chest: Normal effort and positive vesicular breath sounds. No respiratory distress. No wheezes, rales or ronchi noted.  Abdomen: Soft and nontender. Normal bowel sounds. No distention or masses noted.  Musculoskeletal:  Strength 5/5 BUE/BLE. No difficulty with gait.  Neurological: Alert  and oriented. Cranial nerves II-XII grossly intact. Coordination normal.  Psychiatric: Mood and affect normal. Behavior is normal. Judgment and thought content normal.    Assessment and Plan:   Medicare Annual Wellness Visit:  Diet: She does eat meat. She consumes fruits and veggies daily. She rarely eats fried foods. She drinks mostly water and coffee. Physical activity: [employer] 2 x week Depression/mood screen: Negative Hearing: Intact to whispered voice Visual acuity: Grossly normal, performs annual eye exam  ADLs: Capable Fall risk: None Home safety: Good Cognitive evaluation: Intact to orientation, naming, recall and repetition EOL planning: No adv directives, full code/ I agree  Preventative Medicine: All immunizations are UTD. Pap smear due 04/2018. Bone density ordered, she will get that done at the same time. She no longer wants pap smears or colonoscopy. Encouraged her to consume a balanced diet and exercise regimen. Advised her  to see an eye doctor and dentist annually. Will check CBC, CMET, Lipid, TSH, Free T4 and Vit D today.  Gas, Bloating and Loose Stools after Milk Consumption:  Will refer to GI for further evaluation  Glaucoma, Macular Dengeneration:  Referral placed to opthalmology Next appointment:   Nicki Reaper, NP

## 2017-08-06 NOTE — Addendum Note (Signed)
Addended by: Lorre MunroeBAITY, REGINA W on: 08/06/2017 04:14 PM   Modules accepted: Orders

## 2017-08-06 NOTE — Patient Instructions (Signed)
Health Maintenance for Postmenopausal Women Menopause is a normal process in which your reproductive ability comes to an end. This process happens gradually over a span of months to years, usually between the ages of 22 and 9. Menopause is complete when you have missed 12 consecutive menstrual periods. It is important to talk with your health care provider about some of the most common conditions that affect postmenopausal women, such as heart disease, cancer, and bone loss (osteoporosis). Adopting a healthy lifestyle and getting preventive care can help to promote your health and wellness. Those actions can also lower your chances of developing some of these common conditions. What should I know about menopause? During menopause, you may experience a number of symptoms, such as:  Moderate-to-severe hot flashes.  Night sweats.  Decrease in sex drive.  Mood swings.  Headaches.  Tiredness.  Irritability.  Memory problems.  Insomnia.  Choosing to treat or not to treat menopausal changes is an individual decision that you make with your health care provider. What should I know about hormone replacement therapy and supplements? Hormone therapy products are effective for treating symptoms that are associated with menopause, such as hot flashes and night sweats. Hormone replacement carries certain risks, especially as you become older. If you are thinking about using estrogen or estrogen with progestin treatments, discuss the benefits and risks with your health care provider. What should I know about heart disease and stroke? Heart disease, heart attack, and stroke become more likely as you age. This may be due, in part, to the hormonal changes that your body experiences during menopause. These can affect how your body processes dietary fats, triglycerides, and cholesterol. Heart attack and stroke are both medical emergencies. There are many things that you can do to help prevent heart disease  and stroke:  Have your blood pressure checked at least every 1-2 years. High blood pressure causes heart disease and increases the risk of stroke.  If you are 53-22 years old, ask your health care provider if you should take aspirin to prevent a heart attack or a stroke.  Do not use any tobacco products, including cigarettes, chewing tobacco, or electronic cigarettes. If you need help quitting, ask your health care provider.  It is important to eat a healthy diet and maintain a healthy weight. ? Be sure to include plenty of vegetables, fruits, low-fat dairy products, and lean protein. ? Avoid eating foods that are high in solid fats, added sugars, or salt (sodium).  Get regular exercise. This is one of the most important things that you can do for your health. ? Try to exercise for at least 150 minutes each week. The type of exercise that you do should increase your heart rate and make you sweat. This is known as moderate-intensity exercise. ? Try to do strengthening exercises at least twice each week. Do these in addition to the moderate-intensity exercise.  Know your numbers.Ask your health care provider to check your cholesterol and your blood glucose. Continue to have your blood tested as directed by your health care provider.  What should I know about cancer screening? There are several types of cancer. Take the following steps to reduce your risk and to catch any cancer development as early as possible. Breast Cancer  Practice breast self-awareness. ? This means understanding how your breasts normally appear and feel. ? It also means doing regular breast self-exams. Let your health care provider know about any changes, no matter how small.  If you are 40  or older, have a clinician do a breast exam (clinical breast exam or CBE) every year. Depending on your age, family history, and medical history, it may be recommended that you also have a yearly breast X-ray (mammogram).  If you  have a family history of breast cancer, talk with your health care provider about genetic screening.  If you are at high risk for breast cancer, talk with your health care provider about having an MRI and a mammogram every year.  Breast cancer (BRCA) gene test is recommended for women who have family members with BRCA-related cancers. Results of the assessment will determine the need for genetic counseling and BRCA1 and for BRCA2 testing. BRCA-related cancers include these types: ? Breast. This occurs in males or females. ? Ovarian. ? Tubal. This may also be called fallopian tube cancer. ? Cancer of the abdominal or pelvic lining (peritoneal cancer). ? Prostate. ? Pancreatic.  Cervical, Uterine, and Ovarian Cancer Your health care provider may recommend that you be screened regularly for cancer of the pelvic organs. These include your ovaries, uterus, and vagina. This screening involves a pelvic exam, which includes checking for microscopic changes to the surface of your cervix (Pap test).  For women ages 21-65, health care providers may recommend a pelvic exam and a Pap test every three years. For women ages 79-65, they may recommend the Pap test and pelvic exam, combined with testing for human papilloma virus (HPV), every five years. Some types of HPV increase your risk of cervical cancer. Testing for HPV may also be done on women of any age who have unclear Pap test results.  Other health care providers may not recommend any screening for nonpregnant women who are considered low risk for pelvic cancer and have no symptoms. Ask your health care provider if a screening pelvic exam is right for you.  If you have had past treatment for cervical cancer or a condition that could lead to cancer, you need Pap tests and screening for cancer for at least 20 years after your treatment. If Pap tests have been discontinued for you, your risk factors (such as having a new sexual partner) need to be  reassessed to determine if you should start having screenings again. Some women have medical problems that increase the chance of getting cervical cancer. In these cases, your health care provider may recommend that you have screening and Pap tests more often.  If you have a family history of uterine cancer or ovarian cancer, talk with your health care provider about genetic screening.  If you have vaginal bleeding after reaching menopause, tell your health care provider.  There are currently no reliable tests available to screen for ovarian cancer.  Lung Cancer Lung cancer screening is recommended for adults 69-62 years old who are at high risk for lung cancer because of a history of smoking. A yearly low-dose CT scan of the lungs is recommended if you:  Currently smoke.  Have a history of at least 30 pack-years of smoking and you currently smoke or have quit within the past 15 years. A pack-year is smoking an average of one pack of cigarettes per day for one year.  Yearly screening should:  Continue until it has been 15 years since you quit.  Stop if you develop a health problem that would prevent you from having lung cancer treatment.  Colorectal Cancer  This type of cancer can be detected and can often be prevented.  Routine colorectal cancer screening usually begins at  age 42 and continues through age 45.  If you have risk factors for colon cancer, your health care provider may recommend that you be screened at an earlier age.  If you have a family history of colorectal cancer, talk with your health care provider about genetic screening.  Your health care provider may also recommend using home test kits to check for hidden blood in your stool.  A small camera at the end of a tube can be used to examine your colon directly (sigmoidoscopy or colonoscopy). This is done to check for the earliest forms of colorectal cancer.  Direct examination of the colon should be repeated every  5-10 years until age 71. However, if early forms of precancerous polyps or small growths are found or if you have a family history or genetic risk for colorectal cancer, you may need to be screened more often.  Skin Cancer  Check your skin from head to toe regularly.  Monitor any moles. Be sure to tell your health care provider: ? About any new moles or changes in moles, especially if there is a change in a mole's shape or color. ? If you have a mole that is larger than the size of a pencil eraser.  If any of your family members has a history of skin cancer, especially at a young age, talk with your health care provider about genetic screening.  Always use sunscreen. Apply sunscreen liberally and repeatedly throughout the day.  Whenever you are outside, protect yourself by wearing long sleeves, pants, a wide-brimmed hat, and sunglasses.  What should I know about osteoporosis? Osteoporosis is a condition in which bone destruction happens more quickly than new bone creation. After menopause, you may be at an increased risk for osteoporosis. To help prevent osteoporosis or the bone fractures that can happen because of osteoporosis, the following is recommended:  If you are 46-71 years old, get at least 1,000 mg of calcium and at least 600 mg of vitamin D per day.  If you are older than age 55 but younger than age 65, get at least 1,200 mg of calcium and at least 600 mg of vitamin D per day.  If you are older than age 54, get at least 1,200 mg of calcium and at least 800 mg of vitamin D per day.  Smoking and excessive alcohol intake increase the risk of osteoporosis. Eat foods that are rich in calcium and vitamin D, and do weight-bearing exercises several times each week as directed by your health care provider. What should I know about how menopause affects my mental health? Depression may occur at any age, but it is more common as you become older. Common symptoms of depression  include:  Low or sad mood.  Changes in sleep patterns.  Changes in appetite or eating patterns.  Feeling an overall lack of motivation or enjoyment of activities that you previously enjoyed.  Frequent crying spells.  Talk with your health care provider if you think that you are experiencing depression. What should I know about immunizations? It is important that you get and maintain your immunizations. These include:  Tetanus, diphtheria, and pertussis (Tdap) booster vaccine.  Influenza every year before the flu season begins.  Pneumonia vaccine.  Shingles vaccine.  Your health care provider may also recommend other immunizations. This information is not intended to replace advice given to you by your health care provider. Make sure you discuss any questions you have with your health care provider. Document Released: 06/28/2005  Document Revised: 11/24/2015 Document Reviewed: 02/07/2015 Elsevier Interactive Patient Education  2018 [employer]  

## 2017-08-06 NOTE — Telephone Encounter (Signed)
Left message for patient to call Kara back in regards to referrals information and explain why allergy referral was changed to GI referral-Kara Green, RMA

## 2017-08-07 LAB — LIPID PANEL
Cholesterol: 239 mg/dL — ABNORMAL HIGH (ref 0–200)
HDL: 120.9 mg/dL (ref >39.00)
LDL Cholesterol: 103 mg/dL — ABNORMAL HIGH (ref 0–99)
NONHDL: 118.39
TRIGLYCERIDES: 77 mg/dL (ref 0.0–149.0)
Total CHOL/HDL Ratio: 2
VLDL: 15.4 mg/dL (ref 0.0–40.0)

## 2017-08-07 LAB — COMPREHENSIVE METABOLIC PANEL
ALBUMIN: 5 g/dL (ref 3.5–5.2)
ALT: 14 U/L (ref 0–35)
AST: 23 U/L (ref 0–37)
Alkaline Phosphatase: 80 U/L (ref 39–117)
BUN: 16 mg/dL (ref 6–23)
CALCIUM: 10.4 mg/dL (ref 8.4–10.5)
CHLORIDE: 98 meq/L (ref 96–112)
CO2: 28 meq/L (ref 19–32)
Creatinine, Ser: 0.85 mg/dL (ref 0.40–1.20)
GFR: 68.56 mL/min (ref >60.00)
Glucose, Bld: 95 mg/dL (ref 70–99)
Potassium: 4.4 mEq/L (ref 3.5–5.1)
Sodium: 137 mEq/L (ref 135–145)
Total Bilirubin: 0.8 mg/dL (ref 0.2–1.2)
Total Protein: 7.5 g/dL (ref 6.0–8.3)

## 2017-08-29 DIAGNOSIS — H401131 Primary open-angle glaucoma, bilateral, mild stage: Secondary | ICD-10-CM | POA: Diagnosis not present

## 2017-09-03 ENCOUNTER — Other Ambulatory Visit: Payer: Self-pay | Admitting: Internal Medicine

## 2017-09-05 ENCOUNTER — Other Ambulatory Visit: Payer: Self-pay | Admitting: Internal Medicine

## 2017-09-15 ENCOUNTER — Ambulatory Visit (INDEPENDENT_AMBULATORY_CARE_PROVIDER_SITE_OTHER): Payer: Medicare Other | Admitting: Internal Medicine

## 2017-09-15 ENCOUNTER — Encounter: Payer: Self-pay | Admitting: Internal Medicine

## 2017-09-15 VITALS — BP 122/78 | HR 61 | Temp 97.9°F | Wt 125.0 lb

## 2017-09-15 DIAGNOSIS — R002 Palpitations: Secondary | ICD-10-CM | POA: Diagnosis not present

## 2017-09-15 DIAGNOSIS — R1013 Epigastric pain: Secondary | ICD-10-CM

## 2017-09-15 DIAGNOSIS — R140 Abdominal distension (gaseous): Secondary | ICD-10-CM

## 2017-09-15 DIAGNOSIS — R109 Unspecified abdominal pain: Secondary | ICD-10-CM

## 2017-09-15 DIAGNOSIS — R5383 Other fatigue: Secondary | ICD-10-CM | POA: Diagnosis not present

## 2017-09-15 LAB — POC URINALSYSI DIPSTICK (AUTOMATED)
BILIRUBIN UA: NEGATIVE
Blood, UA: NEGATIVE
Glucose, UA: NEGATIVE
Ketones, UA: NEGATIVE
Nitrite, UA: NEGATIVE
Protein, UA: NEGATIVE
SPEC GRAV UA: 1.01 (ref 1.010–1.025)
Urobilinogen, UA: 0.2 E.U./dL
pH, UA: 6.5 (ref 5.0–8.0)

## 2017-09-15 LAB — H. PYLORI ANTIBODY, IGG: H Pylori IgG: NEGATIVE

## 2017-09-15 MED ORDER — CEPHALEXIN 250 MG PO CAPS: 250.0000 mg | ORAL_CAPSULE | Freq: Two times a day (BID) | ORAL | 0 refills | Status: DC

## 2017-09-15 NOTE — Patient Instructions (Signed)

## 2017-09-15 NOTE — Progress Notes (Signed)
Subjective:    Patient ID: Kara Green, female    DOB: 1940-09-27, 77 y.o.   MRN: [id number]  HPI  Pt presents to the clinic today with multiple complaints.  1- She reports over the last few months, she has felt fatigued and just not like herself. She reports she is sleeping well. She denies stress or feelings of anxiety or depression. She denies any changes in diet or activity level. She recently had her thyroid and Vit D levels checked which were normal.   2- She also c/o bilateral flank pain. This has been going intermittently on for 1-2 weeks. She denies urgency, frequency or dysuria but has noticed some dark urine and urine odor. The flank pain is not worse with movement. She denies vaginal complaints. She has not taken anything OTC for her symptoms.   3- She also reports a sharp pain in the left side of her chest. This occurred 1-2 weeks ago. The pain subsided rather quickly. She denies associated dizziness, visual changes, chest tightness or shortness of breath. Since that time, she has had some intermittent fluttering in her left chest (she reports she used to have this all the time while on Nifedipine, but that subsided once she was changed to Amlodipine). The fluttering does not last long. She denies reflux or heartburn. She does have some upper bdominal pain and bloating. She reports her bowels  Are moving normally. She denies blood in the stool. She has not tried anything OTC for her symptoms.  4- She also wants to follow up abnormal xray. She reports she had an xray 05/2017 which requires follow up. They saw some possible COPD. She does not smoke. She denies cough or shortness of breath.   Review of Systems      Past Medical History:  Diagnosis Date  . Arthritis   . Glaucoma   . Hypertension   . Thyroid disease     Current Outpatient Medications  Medication Sig Dispense Refill  . amLODipine (NORVASC) 10 MG tablet Take 1 tablet (10 mg total) by mouth daily. 90 tablet  2  . Cholecalciferol (VITAMIN D) 2000 units CAPS Take 1 capsule by mouth daily.    . fluticasone (FLONASE) 50 MCG/ACT nasal spray Place 2 sprays into both nostrils daily.    Marland Kitchen latanoprost (XALATAN) 0.005 % ophthalmic solution Place 1 drop into both eyes at bedtime.    Marland Kitchen levothyroxine (SYNTHROID, LEVOTHROID) 125 MCG tablet TAKE 0.5 TABLETS (62.5 MCG TOTAL) BY MOUTH DAILY BEFORE BREAKFAST. 30 tablet 0  . spironolactone (ALDACTONE) 25 MG tablet Take 2 tablets (50 mg total) by mouth daily. 180 tablet 0  . timolol (TIMOPTIC) 0.5 % ophthalmic solution INSTILL 1 DROP INTO BOTH EYES EVERY DAY  6   No current facility-administered medications for this visit.     Allergies  Allergen Reactions  . Benadryl [Diphenhydramine Hcl]   . Codeine   . Neurontin [Gabapentin]   . Penicillins   . Sulfa Antibiotics   . Tetracyclines & Related     Family History  Problem Relation Age of Onset  . Arthritis Mother   . Breast cancer Mother        in 43's  . Stroke Mother   . Hypertension Mother   . Hypertension Father   . Diabetes Daughter   . Diabetes Son   . Arthritis Maternal Grandmother   . Arthritis Maternal Grandfather     Social History   Socioeconomic History  . Marital status: Single  Spouse name: Not on file  . Number of children: Not on file  . Years of education: Not on file  . Highest education level: Not on file  Occupational History  . Not on file  Social Needs  . Financial resource strain: Not on file  . Food insecurity:    Worry: Not on file    Inability: Not on file  . Transportation needs:    Medical: Not on file    Non-medical: Not on file  Tobacco Use  . Smoking status: Former Games developer  . Smokeless tobacco: Never Used  . Tobacco comment: quit 25 years ago  Substance and Sexual Activity  . Alcohol use: Yes    Alcohol/week: 4.2 oz    Types: 7 Glasses of wine per week    Comment: with dinner  . Drug use: No  . Sexual activity: Not Currently  Lifestyle  .  Physical activity:    Days per week: Not on file    Minutes per session: Not on file  . Stress: Not on file  Relationships  . Social connections:    Talks on phone: Not on file    Gets together: Not on file    Attends religious service: Not on file    Active member of club or organization: Not on file    Attends meetings of clubs or organizations: Not on file    Relationship status: Not on file  . Intimate partner violence:    Fear of current or ex partner: Not on file    Emotionally abused: Not on file    Physically abused: Not on file    Forced sexual activity: Not on file  Other Topics Concern  . Not on file  Social History Narrative  . Not on file     Constitutional: Pt reports fatigue. Denies fever, malaise,  headache or abrupt weight changes.  HEENT: Denies eye pain, eye redness, ear pain, ringing in the ears, wax buildup, runny nose, nasal congestion, bloody nose, or sore throat. Respiratory: Denies difficulty breathing, shortness of breath, cough or sputum production.   Cardiovascular: Pt reports intermittent chest pain and palpitations. Denies chest tightness, or swelling in the hands or feet.  Gastrointestinal: Pt reports abdominal pain and bloating. Denies constipation, diarrhea or blood in the stool.  GU: Pt reports bilateral flank pain, dark urine and urine odor. Denies urgency, frequency, pain with urination, burning sensation, blood in urine, or discharge. Musculoskeletal: Denies decrease in range of motion, difficulty with gait, muscle pain or joint pain and swelling.  Skin: Denies redness, rashes, lesions or ulcercations.  Neurological: Denies dizziness, difficulty with memory, difficulty with speech or problems with balance and coordination.  Psych: Denies anxiety, depression, SI/HI.  No other specific complaints in a complete review of systems (except as listed in HPI above).  Objective:   Physical Exam  BP 122/78   Pulse 61   Temp 97.9 F (36.6 C) (Oral)    Wt 125 lb (56.7 kg)   SpO2 96%   BMI 25.03 kg/m  Wt Readings from Last 3 Encounters:  09/15/17 125 lb (56.7 kg)  08/06/17 121 lb (54.9 kg)  12/31/16 119 lb (54 kg)    General: Appears her stated age, well developed, well nourished in NAD. Skin: Warm, dry and intact. No rashes noted. Cardiovascular: Normal rate and rhythm. Occasional ectopic beat noted. Pulmonary/Chest: Normal effort and positive vesicular breath sounds. No respiratory distress. No wheezes, rales or ronchi noted.  Abdomen: Soft and mildly tender in  the epigastric region. Normal bowel sounds. No CVA tenderness noted. Musculoskeletal:  No difficulty with gait.  Neurological: Alert and oriented.  Psychiatric: Mood and affect normal. Behavior is normal. Judgment and thought content normal.    BMET   Lipid Panel     Component Value Date/Time   CHOL 239 (H) 08/06/2017 1504   TRIG 77.0 08/06/2017 1504   HDL 120.90 08/06/2017 1504   CHOLHDL 2 08/06/2017 1504   VLDL 15.4 08/06/2017 1504   LDLCALC 103 (H) 08/06/2017 1504    CBC    Component Value Date/Time   WBC 4.7 08/06/2017 1504   RBC 4.45 08/06/2017 1504   HGB 14.7 08/06/2017 1504   HCT 44.5 08/06/2017 1504   PLT 215.0 08/06/2017 1504   MCV 100.0 08/06/2017 1504   MCHC 33.0 08/06/2017 1504   RDW 13.7 08/06/2017 1504    Hgb A1C No results found for: HGBA1C         Assessment & Plan:   Fatigue, Palpitations:  I wanted to check B12 but insurance wouldn't cover and she does not want to pay out of pocket Will monitor for now ECG with ? Multiple PVC's Referral to cardiology placed for further evaluation  Bilateral Flank Pain:  Urinalysis: 3+ leuks Encouraged fluids eRx for Keflex 250 mg BID x 5 days  Epigastric Pain, Bloating:  Will check H Pylori IgG May need Ranitidine or Omeprazole but will hold off on this until labs return  Return precautions discussed Nicki Reaper, NP

## 2017-09-15 NOTE — Addendum Note (Signed)
Addended by: Roena Malady on: 09/15/2017 05:34 PM   Modules accepted: Orders

## 2017-09-23 ENCOUNTER — Other Ambulatory Visit: Payer: Self-pay | Admitting: Internal Medicine

## 2017-10-06 DIAGNOSIS — H401131 Primary open-angle glaucoma, bilateral, mild stage: Secondary | ICD-10-CM | POA: Diagnosis not present

## 2017-10-16 ENCOUNTER — Ambulatory Visit: Payer: Medicare Other | Admitting: Cardiology

## 2017-11-05 ENCOUNTER — Other Ambulatory Visit: Payer: Self-pay | Admitting: Internal Medicine

## 2017-11-07 ENCOUNTER — Ambulatory Visit: Payer: Self-pay | Admitting: *Deleted

## 2017-11-07 NOTE — Telephone Encounter (Signed)
Pt reports increased abdominal "Bloating." Seen 09/15/17 by Pamala Hurry. Baity for flank pain (UTI) and abdominal similar symptoms. HPylori negative. Pt states bloating has gradually worsened since April. Abdominal pain "Is from being so tight, just under bra line all the way down my abdomen." 10/10 at times, brief, intermittent. Dull constant discomfort. Denies fever, states mild nausea after eating at times. No emesis. Denies SOB, pain does not radiate. States BMs WNL, LBM today. Also reports lethargy "Just don't feel like myself." NT offered to secure appt for today; pt declined, wishes to see R. Baity. Appt made for Monday 11/10/17. Care advise given per protocol. Pt directed to ED if symptoms worsen. Reason for Disposition . Abdominal pain is a chronic symptom (recurrent or ongoing AND present > 4 weeks)  Answer Assessment - Initial Assessment Questions 1. LOCATION: "Where does it hurt?"      Just under bra line all the way down 2. RADIATION: "Does the pain shoot anywhere else?" (e.g., chest, back)     no 3. ONSET: "When did the pain begin?" (e.g., minutes, hours or days ago)      April 4. SUDDEN: "Gradual or sudden onset?"     Gradual over last several months 5. PATTERN "Does the pain come and go, or is it constant?"    - If constant: "Is it getting better, staying the same, or worsening?"      (Note: Constant means the pain never goes away completely; most serious pain is constant and it progresses)     - If intermittent: "How long does it last?" "Do you have pain now?"     (Note: Intermittent means the pain goes away completely between bouts)     Worse after eating, nausea, comes and goes 6. SEVERITY: "How bad is the pain?"  (e.g., Scale 1-10; mild, moderate, or severe)   - MILD (1-3): doesn't interfere with normal activities, abdomen soft and not tender to touch    - MODERATE (4-7): interferes with normal activities or awakens from sleep, tender to touch    - SEVERE (8-10): excruciating pain,  doubled over, unable to do any normal activities      Varies,10/10 at times "From tightness with bloating." 7. RECURRENT SYMPTOM: "Have you ever had this type of abdominal pain before?" If so, ask: "When was the last time?" and "What happened that time?"      Yes, but worse, seen 4/29 8. CAUSE: "What do you think is causing the abdominal pain?"     Bloating 9. RELIEVING/AGGRAVATING FACTORS: "What makes it better or worse?" (e.g., movement, antacids, bowel movement)     No 10. OTHER SYMPTOMS: "Has there been any vomiting, diarrhea, constipation, or urine problems?"       "Just don't feel myself" Nausea at times, fatigued, lethargy, normal BMS  Protocols used: [facility]

## 2017-11-10 ENCOUNTER — Encounter: Payer: Self-pay | Admitting: Internal Medicine

## 2017-11-10 ENCOUNTER — Ambulatory Visit (INDEPENDENT_AMBULATORY_CARE_PROVIDER_SITE_OTHER): Payer: Medicare Other | Admitting: Internal Medicine

## 2017-11-10 VITALS — BP 126/80 | HR 74 | Temp 98.0°F | Wt 127.0 lb

## 2017-11-10 DIAGNOSIS — R1013 Epigastric pain: Secondary | ICD-10-CM

## 2017-11-10 DIAGNOSIS — R140 Abdominal distension (gaseous): Secondary | ICD-10-CM

## 2017-11-10 DIAGNOSIS — R1012 Left upper quadrant pain: Secondary | ICD-10-CM

## 2017-11-10 LAB — COMPREHENSIVE METABOLIC PANEL
ALBUMIN: 4.8 g/dL (ref 3.5–5.2)
ALT: 12 U/L (ref 0–35)
AST: 20 U/L (ref 0–37)
Alkaline Phosphatase: 70 U/L (ref 39–117)
BILIRUBIN TOTAL: 1.2 mg/dL (ref 0.2–1.2)
BUN: 18 mg/dL (ref 6–23)
CALCIUM: 9.8 mg/dL (ref 8.4–10.5)
CHLORIDE: 97 meq/L (ref 96–112)
CO2: 30 meq/L (ref 19–32)
CREATININE: 0.84 mg/dL (ref 0.40–1.20)
GFR: 69.46 mL/min (ref >60.00)
Glucose, Bld: 96 mg/dL (ref 70–99)
Potassium: 4.1 mEq/L (ref 3.5–5.1)
Sodium: 135 mEq/L (ref 135–145)
Total Protein: 7.8 g/dL (ref 6.0–8.3)

## 2017-11-10 LAB — LIPASE: LIPASE: 38 U/L (ref 11.0–59.0)

## 2017-11-10 LAB — AMYLASE: Amylase: 42 U/L (ref 27–131)

## 2017-11-10 NOTE — Patient Instructions (Signed)

## 2017-11-10 NOTE — Progress Notes (Signed)
Subjective:    Patient ID: Kara Green, female    DOB: Jan 31, 1941, 77 y.o.   MRN: [id number]  HPI  Pt presents to the clinic today with c/o abdominal pain and bloating. This started 2 months ago. It is intermittent. She describes the abdominal pain as dull, achy but can feel very tight at times. She has some new pain in the epigastric area. She reports some nausea, no vomiting. She is having normal bowel movements, last BM today. She denies blood in the stool. She has tested negative for H Pylori infection. Her last colonoscopy was < 10 years ago. She has gained 6 lbs in the last 3 months. She denies changes in diet. She reports her eye doctor changed her eye drops about 1 month ago, and thinks this could be related. She denies recent travel. She has not tried anything OTC for her symptoms.  Review of Systems      Past Medical History:  Diagnosis Date  . Arthritis   . Glaucoma   . Hypertension   . Thyroid disease     Current Outpatient Medications  Medication Sig Dispense Refill  . amLODipine (NORVASC) 10 MG tablet Take 1 tablet (10 mg total) by mouth daily. 90 tablet 2  . cephALEXin (KEFLEX) 250 MG capsule Take 1 capsule (250 mg total) by mouth 2 (two) times daily. 10 capsule 0  . Cholecalciferol (VITAMIN D) 2000 units CAPS Take 1 capsule by mouth daily.    . fluticasone (FLONASE) 50 MCG/ACT nasal spray Place 2 sprays into both nostrils daily.    Marland Kitchen. latanoprost (XALATAN) 0.005 % ophthalmic solution Place 1 drop into both eyes at bedtime.    Marland Kitchen. levothyroxine (SYNTHROID, LEVOTHROID) 125 MCG tablet TAKE 0.5 TABLETS (62.5 MCG TOTAL) BY MOUTH DAILY BEFORE BREAKFAST. 30 tablet 6  . spironolactone (ALDACTONE) 25 MG tablet TAKE 2 TABLETS BY MOUTH EVERY DAY 180 tablet 2  . timolol (TIMOPTIC) 0.5 % ophthalmic solution INSTILL 1 DROP INTO BOTH EYES TWICE DAILY  6   No current facility-administered medications for this visit.     Allergies  Allergen Reactions  . Benadryl  [Diphenhydramine Hcl]   . Codeine   . Neurontin [Gabapentin]   . Penicillins   . Sulfa Antibiotics   . Tetracyclines & Related     Family History  Problem Relation Age of Onset  . Arthritis Mother   . Breast cancer Mother        in 8280's  . Stroke Mother   . Hypertension Mother   . Hypertension Father   . Diabetes Daughter   . Diabetes Son   . Arthritis Maternal Grandmother   . Arthritis Maternal Grandfather     Social History   Socioeconomic History  . Marital status: Single    Spouse name: Not on file  . Number of children: Not on file  . Years of education: Not on file  . Highest education level: Not on file  Occupational History  . Not on file  Social Needs  . Financial resource strain: Not on file  . Food insecurity:    Worry: Not on file    Inability: Not on file  . Transportation needs:    Medical: Not on file    Non-medical: Not on file  Tobacco Use  . Smoking status: Former Games developermoker  . Smokeless tobacco: Never Used  . Tobacco comment: quit 25 years ago  Substance and Sexual Activity  . Alcohol use: Yes    Alcohol/week: 4.2 oz  Types: 7 Glasses of wine per week    Comment: with dinner  . Drug use: No  . Sexual activity: Not Currently  Lifestyle  . Physical activity:    Days per week: Not on file    Minutes per session: Not on file  . Stress: Not on file  Relationships  . Social connections:    Talks on phone: Not on file    Gets together: Not on file    Attends religious service: Not on file    Active member of club or organization: Not on file    Attends meetings of clubs or organizations: Not on file    Relationship status: Not on file  . Intimate partner violence:    Fear of current or ex partner: Not on file    Emotionally abused: Not on file    Physically abused: Not on file    Forced sexual activity: Not on file  Other Topics Concern  . Not on file  Social History Narrative  . Not on file     Constitutional: Denies fever,  malaise, fatigue, headache or abrupt weight changes.  Respiratory: Denies difficulty breathing, shortness of breath, cough or sputum production.   Cardiovascular: Denies chest pain, chest tightness, palpitations or swelling in the hands or feet.  Gastrointestinal: Pt reports abdominal pain, bloating and nausea. Denies constipation, diarrhea or blood in the stool.  GU: Denies urgency, frequency, pain with urination, burning sensation, blood in urine, odor or discharge.  No other specific complaints in a complete review of systems (except as listed in HPI above).  Objective:   Physical Exam  BP 126/80   Pulse 74   Temp 98 F (36.7 C) (Oral)   Wt 127 lb (57.6 kg)   SpO2 96%   BMI 25.43 kg/m  Wt Readings from Last 3 Encounters:  11/10/17 127 lb (57.6 kg)  09/15/17 125 lb (56.7 kg)  08/06/17 121 lb (54.9 kg)    General: Appears her stated age, well developed, well nourished in NAD. Cardiovascular: Normal rate and rhythm.  Pulmonary/Chest: Normal effort and positive vesicular breath sounds. No respiratory distress. No wheezes, rales or ronchi noted.  Abdomen: Soft and tender in the epigastric region and LUQ. Hyperactive bowel sounds. No distention or masses noted. Liver, spleen and kidneys non palpable.  BMET    Component Value Date/Time   NA 137 08/06/2017 1504   K 4.4 08/06/2017 1504   CL 98 08/06/2017 1504   CO2 28 08/06/2017 1504   GLUCOSE 95 08/06/2017 1504   BUN 16 08/06/2017 1504   CREATININE 0.85 08/06/2017 1504   CALCIUM 10.4 08/06/2017 1504    Lipid Panel     Component Value Date/Time   CHOL 239 (H) 08/06/2017 1504   TRIG 77.0 08/06/2017 1504   HDL 120.90 08/06/2017 1504   CHOLHDL 2 08/06/2017 1504   VLDL 15.4 08/06/2017 1504   LDLCALC 103 (H) 08/06/2017 1504    CBC    Component Value Date/Time   WBC 4.7 08/06/2017 1504   RBC 4.45 08/06/2017 1504   HGB 14.7 08/06/2017 1504   HCT 44.5 08/06/2017 1504   PLT 215.0 08/06/2017 1504   MCV 100.0 08/06/2017  1504   MCHC 33.0 08/06/2017 1504   RDW 13.7 08/06/2017 1504    Hgb A1C No results found for: HGBA1C          Assessment & Plan:   Abdominal Pain, Nausea and Bloating:  Repeat CMET today Add Amylase and Lipase CT abdomen today-  will call you with the results If persist, will refer to GI Consider PPI, but she wants to hold off on medication at this time  Return precautions disucssed Nicki Reaper, NP

## 2017-11-17 DIAGNOSIS — H401131 Primary open-angle glaucoma, bilateral, mild stage: Secondary | ICD-10-CM | POA: Diagnosis not present

## 2017-11-18 ENCOUNTER — Ambulatory Visit (INDEPENDENT_AMBULATORY_CARE_PROVIDER_SITE_OTHER)
Admission: RE | Admit: 2017-11-18 | Discharge: 2017-11-18 | Disposition: A | Discharge status: Home / Self Care | Payer: Medicare Other | Source: Ambulatory Visit | Attending: Internal Medicine | Admitting: Internal Medicine

## 2017-11-18 DIAGNOSIS — R1013 Epigastric pain: Secondary | ICD-10-CM | POA: Diagnosis not present

## 2017-11-18 DIAGNOSIS — R1012 Left upper quadrant pain: Secondary | ICD-10-CM

## 2017-11-18 DIAGNOSIS — R140 Abdominal distension (gaseous): Secondary | ICD-10-CM

## 2017-11-18 DIAGNOSIS — K5730 Diverticulosis of large intestine without perforation or abscess without bleeding: Secondary | ICD-10-CM | POA: Diagnosis not present

## 2017-11-18 MED ORDER — IOPAMIDOL (ISOVUE-300) INJECTION 61%
100.0000 mL | Freq: Once | INTRAVENOUS | Status: AC | PRN
  Administered 2017-11-18: 100 mL via INTRAVENOUS

## 2017-11-19 ENCOUNTER — Other Ambulatory Visit: Payer: Self-pay | Admitting: Internal Medicine

## 2017-11-19 DIAGNOSIS — L989 Disorder of the skin and subcutaneous tissue, unspecified: Secondary | ICD-10-CM

## 2017-11-19 DIAGNOSIS — R1012 Left upper quadrant pain: Secondary | ICD-10-CM

## 2017-11-28 ENCOUNTER — Ambulatory Visit (INDEPENDENT_AMBULATORY_CARE_PROVIDER_SITE_OTHER): Payer: Medicare Other | Admitting: Podiatry

## 2017-11-28 ENCOUNTER — Encounter: Payer: Self-pay | Admitting: Internal Medicine

## 2017-11-28 DIAGNOSIS — B351 Tinea unguium: Secondary | ICD-10-CM | POA: Diagnosis not present

## 2017-12-09 ENCOUNTER — Encounter: Payer: Self-pay | Admitting: Gastroenterology

## 2017-12-09 NOTE — Progress Notes (Signed)
   Subjective:    Patient ID: Kara Green, female    DOB: Jan 31, 1941, 77 y.o.   MRN: [id number]  HPI   Chief Complaint  Patient presents with  . Nail Problem    right hallux, nail coming off - it's thick and brittle   77 y.o. female presents with the above complaint.  Reports the right great toenail is coming off seems to be thick and brittle    Objective:   Physical Exam There were no vitals filed for this visit. General AA&O x3. Normal mood and affect.  Vascular Dorsalis pedis and posterior tibial pulses  present 2+ bilaterally  Capillary refill normal to all digits. Pedal hair growth normal.  Neurologic Epicritic sensation grossly present.  Dermatologic No open lesions. Interspaces clear of maceration. Dystrophy thickening Onikul lysis right great toe black discoloration  Orthopedic: MMT 5/5 in dorsiflexion, plantarflexion, inversion, and eversion. Normal joint ROM without pain or crepitus.      Assessment & Plan:  Patient was evaluated and treated all questions answered  Onychomycosis  -Right great toenail nail debrided to patient relief.  - Educated on self-care.

## 2018-02-04 DIAGNOSIS — H401113 Primary open-angle glaucoma, right eye, severe stage: Secondary | ICD-10-CM | POA: Diagnosis not present

## 2018-02-04 DIAGNOSIS — H401122 Primary open-angle glaucoma, left eye, moderate stage: Secondary | ICD-10-CM | POA: Diagnosis not present

## 2018-02-04 DIAGNOSIS — Z961 Presence of intraocular lens: Secondary | ICD-10-CM | POA: Diagnosis not present

## 2018-02-04 DIAGNOSIS — H353132 Nonexudative age-related macular degeneration, bilateral, intermediate dry stage: Secondary | ICD-10-CM | POA: Diagnosis not present

## 2018-02-05 ENCOUNTER — Ambulatory Visit (INDEPENDENT_AMBULATORY_CARE_PROVIDER_SITE_OTHER): Payer: Medicare Other | Admitting: Gastroenterology

## 2018-02-05 ENCOUNTER — Encounter (INDEPENDENT_AMBULATORY_CARE_PROVIDER_SITE_OTHER): Payer: Self-pay

## 2018-02-05 ENCOUNTER — Other Ambulatory Visit (INDEPENDENT_AMBULATORY_CARE_PROVIDER_SITE_OTHER): Payer: Medicare Other

## 2018-02-05 ENCOUNTER — Encounter: Payer: Self-pay | Admitting: Gastroenterology

## 2018-02-05 VITALS — BP 126/70 | HR 74 | Ht 62.0 in | Wt 128.1 lb

## 2018-02-05 DIAGNOSIS — R1030 Lower abdominal pain, unspecified: Secondary | ICD-10-CM

## 2018-02-05 DIAGNOSIS — R1310 Dysphagia, unspecified: Secondary | ICD-10-CM

## 2018-02-05 DIAGNOSIS — R140 Abdominal distension (gaseous): Secondary | ICD-10-CM

## 2018-02-05 LAB — CBC
HCT: 45.1 % (ref 36.0–46.0)
HEMOGLOBIN: 15.2 g/dL — AB (ref 12.0–15.0)
MCHC: 33.8 g/dL (ref 30.0–36.0)
MCV: 99.1 fl (ref 78.0–100.0)
PLATELETS: 222 10*3/uL (ref 150.0–400.0)
RBC: 4.55 Mil/uL (ref 3.87–5.11)
RDW: 13 % (ref 11.5–15.5)
WBC: 6 10*3/uL (ref 4.0–10.5)

## 2018-02-05 LAB — HEPATIC FUNCTION PANEL
ALBUMIN: 5 g/dL (ref 3.5–5.2)
ALK PHOS: 78 U/L (ref 39–117)
ALT: 14 U/L (ref 0–35)
AST: 22 U/L (ref 0–37)
BILIRUBIN DIRECT: 0.2 mg/dL (ref 0.0–0.3)
Total Bilirubin: 1.3 mg/dL — ABNORMAL HIGH (ref 0.2–1.2)
Total Protein: 8.1 g/dL (ref 6.0–8.3)

## 2018-02-05 LAB — TSH: TSH: 1.12 u[IU]/mL (ref 0.35–4.50)

## 2018-02-05 LAB — HIGH SENSITIVITY CRP: CRP HIGH SENSITIVITY: 2.38 mg/L (ref 0.000–5.000)

## 2018-02-05 LAB — IGA: IgA: 225 mg/dL (ref 68–378)

## 2018-02-05 LAB — CORTISOL: Cortisol, Plasma: 7.2 ug/dL

## 2018-02-05 MED ORDER — SIMETHICONE 80 MG PO CHEW: CHEWABLE_TABLET | ORAL | 2 refills | Status: DC

## 2018-02-05 NOTE — Patient Instructions (Signed)
You have been scheduled for a Barium Esophogram at [facility] (1st floor of the hospital) on 02/20/18 at 930am. Please arrive 15 minutes prior to your appointment for registration. Make certain not to have anything to eat or drink 3 hours prior to your test. If you need to reschedule for any reason, please contact radiology at [phone] to do so. __________________________________________________________________ A barium swallow is an examination that concentrates on views of the esophagus. This tends to be a double contrast exam (barium and two liquids which, when combined, create a gas to distend the wall of the oesophagus) or single contrast (non-ionic iodine based). The study is usually tailored to your symptoms so a good history is essential. Attention is paid during the study to the form, structure and configuration of the esophagus, looking for functional disorders (such as aspiration, dysphagia, achalasia, motility and reflux) EXAMINATION You may be asked to change into a gown, depending on the type of swallow being performed. A radiologist and radiographer will perform the procedure. The radiologist will advise you of the type of contrast selected for your procedure and direct you during the exam. You will be asked to stand, sit or lie in several different positions and to hold a small amount of fluid in your mouth before being asked to swallow while the imaging is performed .In some instances you may be asked to swallow barium coated marshmallows to assess the motility of a solid food bolus. The exam can be recorded as a digital or video fluoroscopy procedure. POST PROCEDURE It will take 1-2 days for the barium to pass through your system. To facilitate this, it is important, unless otherwise directed, to increase your fluids for the next 24-48hrs and to resume your normal diet.  This test typically takes about 30 minutes to perform.  Your provider has requested that you go to the  basement level for lab work before leaving today. Press "B" on the elevator. The lab is located at the first door on the left as you exit the elevator.  We have sent the following medications to your pharmacy for you to pick up at your convenience: Simethicone  __________________________________________________________________________________

## 2018-02-05 NOTE — Progress Notes (Signed)
GASTROENTEROLOGY OUTPATIENT CLINIC VISIT   Primary Care Provider Lorre Munroe, NP [address] [place] [address] [phone]  Referring Provider Lorre Munroe, NP [address] [place], [place] [address] [phone]  Patient Profile: Kara Green is a 77 y.o. female with a pmh significant for hypertension, glaucoma, osteoarthritis, hypothyroidism, recurrent UTIs, reported history of IBS.  The patient presents to the [facility] for an evaluation and management of problem(s) noted below:  Problem List 1. Bloating symptom   2. Lower abdominal pain   3. Dysphagia, unspecified type     History of Present Illness: This is the patient's first visit to the [facility].  She has nicely typed (on an actual typewriter) on our previsit review of systems and gone over her review of systems from a GI perspective and does suffer from multiple issues.  She has circled issues of abdominal pain, belching, bloating/flatulence, chest pain, difficulty swallowing, increased weight gain, change in bowel habits including diarrhea, hemorrhoids.  However, the patient's main issue is abdominal pain as well as bloating/distention for the last 6 months lower quadrants of her abdomen.  Per the patient she is previously carried a diagnosis of irritable bowel syndrome-diarrhea for many years.  She describes from a bowel movement perspective that she has variation such that most of her bowel movements are soft and small.  She describes increased issues of gas from above and below but has not trialed any medications to try to counteract her symptoms of gas.  She has 1 bowel movement daily most often in the morning.  Her symptoms as described above occur less often in the morning or more often occurring during the course of the day.  She does not feel that belching or passing flatus helps her abdominal pain or bloating/distention.  She has not kept a food diary.  She  does describe some issues of dysphagia to solid foods and infrequently to her own saliva that she will have to spit up or regurgitate either.  She does not describe a long history of acid reflux.  She has never tried a lactose-free diet or a fructose free diet.  She is not drinking significant dietary drinks with sugar substitutes.  She has never had an upper endoscopy but reports a prior history of a colonoscopy potentially in December 2009.  GI Review of Systems Positive as above Negative for odynophagia, jaundice, early satiety  Review of Systems General: Denies fevers/chills/weight loss HEENT: Positive for change in vision; denies oral lesions Cardiovascular: Denies chest pain Pulmonary: Denies shortness of breath Gastroenterological: See HPI Genitourinary: Denies darkened urine Hematological: Denies easy bruising/bleeding Endocrine: Denies temperature intolerance Dermatological: She avoids the sun due to blistering fax Psychological: Mood is stable Allergy & Immunology: Positive for sinus troubles/allergies Musculoskeletal: Denies new arthralgias   Medications Current Outpatient Medications  Medication Sig Dispense Refill  . Acetaminophen (TYLENOL ARTHRITIS PAIN PO) Take by mouth daily.    Marland Kitchen amLODipine (NORVASC) 10 MG tablet Take 1 tablet (10 mg total) by mouth daily. 90 tablet 2  . Ascorbic Acid (VITAMIN C) 100 MG tablet Take 100 mg by mouth daily.    Marland Kitchen aspirin EC 81 MG tablet Take 81 mg by mouth daily.    . Cholecalciferol (VITAMIN D) 2000 units CAPS Take 1 capsule by mouth daily.    . Cinnamon 500 MG capsule Take 500 mg by mouth daily.    . Coconut Oil 1000 MG CAPS Take by mouth daily.    Marland Kitchen  Collagen 500 MG CAPS Take by mouth daily.    . Garlic 10 MG CAPS Take by mouth daily.    Marland Kitchen glucosamine-chondroitin 500-400 MG tablet Take 1 tablet by mouth daily.    Marland Kitchen latanoprost (XALATAN) 0.005 % ophthalmic solution Place 1 drop into both eyes at bedtime.    Marland Kitchen levothyroxine (SYNTHROID,  LEVOTHROID) 125 MCG tablet TAKE 0.5 TABLETS (62.5 MCG TOTAL) BY MOUTH DAILY BEFORE BREAKFAST. 30 tablet 6  . loratadine (KLS ALLERCLEAR) 10 MG tablet Take 10 mg by mouth daily.    . Magnesium 400 MG CAPS Take by mouth daily.    . Omega-3 Fatty Acids (FISH OIL) 1200 MG CAPS Take by mouth daily.    . Prenatal Vit-Fe Fumarate-FA (PRENATAL MULTIVITAMIN) TABS tablet Take 1 tablet by mouth daily at 12 noon.    Marland Kitchen spironolactone (ALDACTONE) 25 MG tablet TAKE 2 TABLETS BY MOUTH EVERY DAY 180 tablet 2  . timolol (BETIMOL) 0.25 % ophthalmic solution Place 1-2 drops into both eyes 2 (two) times daily.    . TURMERIC PO Take 720 mg by mouth daily.    . vitamin E 100 UNIT capsule Take by mouth daily.    . simethicone (GAS-X) 80 MG chewable tablet Take 4 times daily as needed 45 tablet 2   No current facility-administered medications for this visit.     Allergies Allergies  Allergen Reactions  . Benadryl [Diphenhydramine Hcl]   . Codeine   . Neurontin [Gabapentin]   . Penicillins   . Sulfa Antibiotics   . Tetracyclines & Related     Histories Past Medical History:  Diagnosis Date  . Arthritis   . Glaucoma   . Hypertension   . IBS (irritable bowel syndrome)   . Thyroid disease   . UTI (urinary tract infection)    Past Surgical History:  Procedure Laterality Date  . ABDOMINAL HYSTERECTOMY  1981   total  . APPENDECTOMY  1981  . HERNIA REPAIR  1980  . TONSILLECTOMY AND ADENOIDECTOMY     Social History   Socioeconomic History  . Marital status: Divorced    Spouse name: Not on file  . Number of children: 3  . Years of education: Not on file  . Highest education level: Not on file  Occupational History  . Occupation: retired  Engineer, production  . Financial resource strain: Not on file  . Food insecurity:    Worry: Not on file    Inability: Not on file  . Transportation needs:    Medical: Not on file    Non-medical: Not on file  Tobacco Use  . Smoking status: Former Games developer  .  Smokeless tobacco: Never Used  . Tobacco comment: quit 25 years ago  Substance and Sexual Activity  . Alcohol use: Yes    Alcohol/week: 7.0 standard drinks    Types: 7 Glasses of wine per week    Comment: with dinner  . Drug use: No  . Sexual activity: Not Currently  Lifestyle  . Physical activity:    Days per week: Not on file    Minutes per session: Not on file  . Stress: Not on file  Relationships  . Social connections:    Talks on phone: Not on file    Gets together: Not on file    Attends religious service: Not on file    Active member of club or organization: Not on file    Attends meetings of clubs or organizations: Not on file    Relationship  status: Not on file  . Intimate partner violence:    Fear of current or ex partner: Not on file    Emotionally abused: Not on file    Physically abused: Not on file    Forced sexual activity: Not on file  Other Topics Concern  . Not on file  Social History Narrative  . Not on file   Family History  Problem Relation Age of Onset  . Arthritis Mother   . Breast cancer Mother        in 64's  . Stroke Mother   . Hypertension Mother   . Hypertension Father   . Diabetes Daughter   . Diabetes Son   . Arthritis Maternal Grandmother   . Arthritis Maternal Grandfather   . Colon cancer Neg Hx   . Esophageal cancer Neg Hx   . Inflammatory bowel disease Neg Hx   . Liver disease Neg Hx   . Pancreatic cancer Neg Hx   . Rectal cancer Neg Hx   . Stomach cancer Neg Hx    I have reviewed her medical, social, and family history in detail and updated the electronic medical record as necessary.    PHYSICAL EXAMINATION  BP 126/70   Pulse 74   Ht 5\' 2"  (1.575 m)   Wt 128 lb 2 oz (58.1 kg)   BMI 23.43 kg/m  Wt Readings from Last 3 Encounters:  02/05/18 128 lb 2 oz (58.1 kg)  11/10/17 127 lb (57.6 kg)  09/15/17 125 lb (56.7 kg)  GEN: NAD, appears younger than stated age, doesn't appear chronically ill PSYCH: Cooperative, without  pressured speech EYE: Conjunctivae pink, sclerae anicteric ENT: MMM, without oral ulcers, no erythema or exudates noted NECK: Supple CV: RR without R/Gs  RESP: CTAB posteriorly GI: NABS, soft, NT/ND, without rebound or guarding, no HSM appreciated MSK/EXT: No lower extremity edema SKIN: No jaundice NEURO:  Alert & Oriented x 3, no focal deficits   REVIEW OF DATA  I reviewed the following data at the time of this encounter:  GI Procedures and Studies  No relevant studies to review  Laboratory Studies  Reviewed in epic  Imaging Studies  July 2019 CT abdomen with contrast IMPRESSION: 1. No acute abnormality.  No CT evidence of pancreatitis. 2. Moderate diffuse colonic diverticulosis. 3. Left lower lobe 3 mm pulmonary nodule. No follow-up needed if patient is low-risk. Non-contrast chest CT can be considered in 12 months if patient is high-risk. This recommendation follows the consensus statement: Guidelines for Management of Incidental Pulmonary Nodules Detected on CT Images:From the Fleischner Society 2017; published online before print (10.1148/radiol.1610960454). 4.  Aortic Atherosclerosis (ICD10-I70.0).   ASSESSMENT  Ms. Mcneeley is a 77 y.o. female with a pmh significant for hypertension, glaucoma, osteoarthritis, hypothyroidism, recurrent UTIs, reported history of IBS.  The patient is seen today for evaluation and management of:  1. Bloating symptom   2. Lower abdominal pain   3. Dysphagia, unspecified type    This is a hemodynamically stable patient who presents for evaluation in the GI clinic.  She has multiple GI issues that we will work over the course of the coming months.  At today's visit her primary focus was issues of bloating/distention as well as she is a solid food/salivary dysphasia.  The underlying etiology is not completely clear but will rule out common etiologies from a metabolic perspective as noted below.  I do wonder about the possibility of underlying  bacterial overgrowth, however she does not have significant diarrhea  currently but has in the past but described as a possible IBS-D patient.  In the future may require breath testing to rule this out.  She may require an upper and lower endoscopic evaluation based on the findings and her laboratories.  I have asked her to try and pursue over the coming weeks a food/symptom diary that we may review it our next visit and see if there are certain foods such as lactose, fructose (she eats fruit at the beginning of her morning) may be causing her issues and that we may be able to work through.  We did not describe a low FODMAP diet as of yet as I am not convinced that this is IBS related currently.  Work-up her esophageal dysphasia by beginning with a barium swallow as she is not wanting procedures unless they are absolutely indicated.  Based on those results and her follow-up will determine the need for an upper endoscopy diagnostic procedure.  We will understand whether a colonoscopy may be required for evaluation of her other GI symptoms.  All patient questions were answered, to the best of my ability, and the patient agrees to the aforementioned plan of action with follow-up as indicated.   PLAN  1. Lower abdominal pain - CBC; Future - Hepatic function panel; Future - TSH; Future - High sensitivity CRP; Future - Cortisol; Future - Tissue transglutaminase, IgA; Future - IgA; Future  2. Bloating symptom - Evaluation as above - Begin Simethicone - Food/Symptom Diary to begin - Will consider EGD/Colonoscopy in future - Consider Breath Testing in future for SIBO - May consider HP stool testing in future  3. Dysphagia, unspecified type - DG Esophagus; Future   Orders Placed This Encounter  Procedures  . DG Esophagus  . CBC  . Hepatic function panel  . TSH  . High sensitivity CRP  . Cortisol  . Tissue transglutaminase, IgA  . IgA    New Prescriptions   SIMETHICONE (GAS-X) 80 MG  CHEWABLE TABLET    Take 4 times daily as needed   Modified Medications   No medications on file    Planned Follow Up: No follow-ups on file.   Corliss ParishGabriel Mansouraty, MD  Gastroenterology [facility] Office # [phone]

## 2018-02-06 ENCOUNTER — Other Ambulatory Visit: Payer: Self-pay

## 2018-02-06 DIAGNOSIS — R17 Unspecified jaundice: Secondary | ICD-10-CM

## 2018-02-06 LAB — TISSUE TRANSGLUTAMINASE, IGA: (tTG) Ab, IgA: 1 U/mL

## 2018-02-09 ENCOUNTER — Encounter: Payer: Self-pay | Admitting: Gastroenterology

## 2018-02-09 DIAGNOSIS — R1310 Dysphagia, unspecified: Secondary | ICD-10-CM | POA: Insufficient documentation

## 2018-02-09 DIAGNOSIS — R140 Abdominal distension (gaseous): Secondary | ICD-10-CM | POA: Insufficient documentation

## 2018-02-09 DIAGNOSIS — R1030 Lower abdominal pain, unspecified: Secondary | ICD-10-CM | POA: Insufficient documentation

## 2018-02-20 ENCOUNTER — Ambulatory Visit (HOSPITAL_COMMUNITY)
Admission: RE | Admit: 2018-02-20 | Discharge: 2018-02-20 | Disposition: A | Discharge status: Home / Self Care | Payer: Medicare Other | Source: Ambulatory Visit | Attending: Gastroenterology | Admitting: Gastroenterology

## 2018-02-20 DIAGNOSIS — K222 Esophageal obstruction: Secondary | ICD-10-CM | POA: Diagnosis not present

## 2018-02-20 DIAGNOSIS — R1310 Dysphagia, unspecified: Secondary | ICD-10-CM | POA: Insufficient documentation

## 2018-02-24 ENCOUNTER — Telehealth: Payer: Self-pay | Admitting: Gastroenterology

## 2018-02-24 NOTE — Telephone Encounter (Signed)
See result note.  

## 2018-02-25 ENCOUNTER — Telehealth: Payer: Self-pay | Admitting: Gastroenterology

## 2018-02-25 NOTE — Telephone Encounter (Signed)
Mansouraty, Netty Starring., MD  Loretha Stapler, RN Cc: Lorre Munroe, NP        Lacora Folmer,  Esophagram shows findings suggestive of possible distal stricture.  If patient is OK with proceeding with scheduling an EGD I am fine for that and it can be done in the [facility].  If she would like to discuss EGD risks/benefits again we can see her in clinic in the next few weeks and then schedule EGD with dilation.  Let me know what she decides.  Thanks.

## 2018-02-26 NOTE — Telephone Encounter (Signed)
The pt has been scheduled for pre visit and EGD.  She will call with any questions

## 2018-03-05 DIAGNOSIS — Z23 Encounter for immunization: Secondary | ICD-10-CM | POA: Diagnosis not present

## 2018-03-16 DIAGNOSIS — R062 Wheezing: Secondary | ICD-10-CM | POA: Diagnosis not present

## 2018-03-16 DIAGNOSIS — I10 Essential (primary) hypertension: Secondary | ICD-10-CM | POA: Diagnosis not present

## 2018-03-16 DIAGNOSIS — R05 Cough: Secondary | ICD-10-CM | POA: Diagnosis not present

## 2018-03-17 DIAGNOSIS — H4322 Crystalline deposits in vitreous body, left eye: Secondary | ICD-10-CM | POA: Diagnosis not present

## 2018-03-17 DIAGNOSIS — H353112 Nonexudative age-related macular degeneration, right eye, intermediate dry stage: Secondary | ICD-10-CM | POA: Diagnosis not present

## 2018-03-17 DIAGNOSIS — H353121 Nonexudative age-related macular degeneration, left eye, early dry stage: Secondary | ICD-10-CM | POA: Diagnosis not present

## 2018-03-19 ENCOUNTER — Ambulatory Visit (AMBULATORY_SURGERY_CENTER): Payer: Self-pay

## 2018-03-19 VITALS — Ht 62.0 in | Wt 126.0 lb

## 2018-03-19 DIAGNOSIS — R1310 Dysphagia, unspecified: Secondary | ICD-10-CM

## 2018-03-19 NOTE — Progress Notes (Signed)
Denies allergies to eggs or soy products. Denies complication of anesthesia or sedation. Denies use of weight loss medication. Denies use of O2.   Emmi instructions declined.  

## 2018-03-23 ENCOUNTER — Telehealth: Payer: Self-pay | Admitting: Gastroenterology

## 2018-03-23 DIAGNOSIS — R070 Pain in throat: Secondary | ICD-10-CM | POA: Diagnosis not present

## 2018-03-23 DIAGNOSIS — I10 Essential (primary) hypertension: Secondary | ICD-10-CM | POA: Diagnosis not present

## 2018-03-23 DIAGNOSIS — R040 Epistaxis: Secondary | ICD-10-CM | POA: Diagnosis not present

## 2018-03-23 NOTE — Telephone Encounter (Signed)
The pt has had bronchitis and today woke up spitting up blood.  She has an EGD on 11/7.  I advised her she needs to call PCP and have them evaluate her and let us know what they find.  We can then make a decision whether or not she should keep EGD.  The pt agreed

## 2018-03-23 NOTE — Telephone Encounter (Signed)
Called back to advise that her issue was resolved and she is planning to be here for her EGD as planned.

## 2018-03-23 NOTE — Telephone Encounter (Signed)
Noted  

## 2018-03-25 ENCOUNTER — Telehealth: Payer: Self-pay | Admitting: Gastroenterology

## 2018-03-25 DIAGNOSIS — Z961 Presence of intraocular lens: Secondary | ICD-10-CM | POA: Diagnosis not present

## 2018-03-25 DIAGNOSIS — H353132 Nonexudative age-related macular degeneration, bilateral, intermediate dry stage: Secondary | ICD-10-CM | POA: Diagnosis not present

## 2018-03-25 DIAGNOSIS — H401122 Primary open-angle glaucoma, left eye, moderate stage: Secondary | ICD-10-CM | POA: Diagnosis not present

## 2018-03-25 DIAGNOSIS — H401113 Primary open-angle glaucoma, right eye, severe stage: Secondary | ICD-10-CM | POA: Diagnosis not present

## 2018-03-25 NOTE — Telephone Encounter (Signed)
Returned phone call to patient, she states that she has been advised to start an antibiotic (Clindamycin) due to an infected tooth. She can push on the tooth and pus comes out of it. Spoke with Dr. Meridee Score who recommends canceling procedure at this time and rescheduling once dental treatment plan is completed, which includes pulling the tooth.

## 2018-03-26 ENCOUNTER — Encounter: Payer: Medicare Other | Admitting: Gastroenterology

## 2018-04-22 DIAGNOSIS — H401113 Primary open-angle glaucoma, right eye, severe stage: Secondary | ICD-10-CM | POA: Diagnosis not present

## 2018-04-27 ENCOUNTER — Other Ambulatory Visit: Payer: Self-pay | Admitting: Internal Medicine

## 2018-04-27 DIAGNOSIS — Z1231 Encounter for screening mammogram for malignant neoplasm of breast: Secondary | ICD-10-CM

## 2018-04-29 ENCOUNTER — Other Ambulatory Visit: Payer: Self-pay | Admitting: Internal Medicine

## 2018-05-05 ENCOUNTER — Other Ambulatory Visit: Payer: Self-pay | Admitting: Internal Medicine

## 2018-05-11 ENCOUNTER — Encounter: Payer: Self-pay | Admitting: Internal Medicine

## 2018-05-11 DIAGNOSIS — Z961 Presence of intraocular lens: Secondary | ICD-10-CM | POA: Diagnosis not present

## 2018-05-11 DIAGNOSIS — H5319 Other subjective visual disturbances: Secondary | ICD-10-CM | POA: Diagnosis not present

## 2018-05-11 DIAGNOSIS — H401113 Primary open-angle glaucoma, right eye, severe stage: Secondary | ICD-10-CM | POA: Diagnosis not present

## 2018-05-11 DIAGNOSIS — H401122 Primary open-angle glaucoma, left eye, moderate stage: Secondary | ICD-10-CM | POA: Diagnosis not present

## 2018-06-08 ENCOUNTER — Ambulatory Visit
Admission: RE | Admit: 2018-06-08 | Discharge: 2018-06-08 | Disposition: A | Discharge status: Home / Self Care | Payer: Medicare Other | Source: Ambulatory Visit | Attending: Internal Medicine | Admitting: Internal Medicine

## 2018-06-08 DIAGNOSIS — Z1231 Encounter for screening mammogram for malignant neoplasm of breast: Secondary | ICD-10-CM | POA: Diagnosis not present

## 2018-06-29 ENCOUNTER — Ambulatory Visit (INDEPENDENT_AMBULATORY_CARE_PROVIDER_SITE_OTHER)
Admission: RE | Admit: 2018-06-29 | Discharge: 2018-06-29 | Disposition: A | Discharge status: Home / Self Care | Payer: Medicare Other | Source: Ambulatory Visit | Attending: Internal Medicine | Admitting: Internal Medicine

## 2018-06-29 ENCOUNTER — Encounter: Payer: Self-pay | Admitting: Internal Medicine

## 2018-06-29 ENCOUNTER — Ambulatory Visit (INDEPENDENT_AMBULATORY_CARE_PROVIDER_SITE_OTHER): Payer: Medicare Other | Admitting: Internal Medicine

## 2018-06-29 VITALS — BP 124/68 | HR 74 | Temp 97.9°F | Wt 131.0 lb

## 2018-06-29 DIAGNOSIS — M899 Disorder of bone, unspecified: Secondary | ICD-10-CM

## 2018-06-29 DIAGNOSIS — G8929 Other chronic pain: Secondary | ICD-10-CM | POA: Diagnosis not present

## 2018-06-29 DIAGNOSIS — R1032 Left lower quadrant pain: Secondary | ICD-10-CM | POA: Diagnosis not present

## 2018-06-29 DIAGNOSIS — M25512 Pain in left shoulder: Secondary | ICD-10-CM | POA: Diagnosis not present

## 2018-06-29 DIAGNOSIS — L989 Disorder of the skin and subcutaneous tissue, unspecified: Secondary | ICD-10-CM

## 2018-06-29 DIAGNOSIS — H9202 Otalgia, left ear: Secondary | ICD-10-CM | POA: Diagnosis not present

## 2018-06-29 DIAGNOSIS — R1030 Lower abdominal pain, unspecified: Secondary | ICD-10-CM | POA: Diagnosis not present

## 2018-06-29 NOTE — Patient Instructions (Signed)
Shoulder Exercises Ask your health care provider which exercises are safe for you. Do exercises exactly as told by your health care provider and adjust them as directed. It is normal to feel mild stretching, pulling, tightness, or discomfort as you do these exercises, but you should stop right away if you feel sudden pain or your pain gets worse.Do not begin these exercises until told by your health care provider. Range of Motion Exercises        These exercises warm up your muscles and joints and improve the movement and flexibility of your shoulder. These exercises also help to relieve pain, numbness, and tingling. These exercises involve stretching your injured shoulder directly. Exercise A: Pendulum 1. Stand near a wall or a surface that you can hold onto for balance. 2. Bend at the waist and let your left / right arm hang straight down. Use your other arm to support you. Keep your back straight and do not lock your knees. 3. Relax your left / right arm and shoulder muscles, and move your hips and your trunk so your left / right arm swings freely. Your arm should swing because of the motion of your body, not because you are using your arm or shoulder muscles. 4. Keep moving your body so your arm swings in the following directions, as told by your health care provider: ? Side to side. ? Forward and backward. ? In clockwise and counterclockwise circles. 5. Continue each motion for __________ seconds, or for as long as told by your health care provider. 6. Slowly return to the starting position. Repeat __________ times. Complete this exercise __________ times a day. Exercise B:Flexion, Standing 1. Stand and hold a broomstick, a cane, or a similar object. Place your hands a little more than shoulder-width apart on the object. Your left / right hand should be palm-up, and your other hand should be palm-down. 2. Keep your elbow straight and keep your shoulder muscles relaxed. Push the stick  down with your healthy arm to raise your left / right arm in front of your body, and then over your head until you feel a stretch in your shoulder. ? Avoid shrugging your shoulder while you raise your arm. Keep your shoulder blade tucked down toward the middle of your back. 3. Hold for __________ seconds. 4. Slowly return to the starting position. Repeat __________ times. Complete this exercise __________ times a day. Exercise C: Abduction, Standing 1. Stand and hold a broomstick, a cane, or a similar object. Place your hands a little more than shoulder-width apart on the object. Your left / right hand should be palm-up, and your other hand should be palm-down. 2. While keeping your elbow straight and your shoulder muscles relaxed, push the stick across your body toward your left / right side. Raise your left / right arm to the side of your body and then over your head until you feel a stretch in your shoulder. ? Do not raise your arm above shoulder height, unless your health care provider tells you to do that. ? Avoid shrugging your shoulder while you raise your arm. Keep your shoulder blade tucked down toward the middle of your back. 3. Hold for __________ seconds. 4. Slowly return to the starting position. Repeat __________ times. Complete this exercise __________ times a day. Exercise D:Internal Rotation 1. Place your left / right hand behind your back, palm-up. 2. Use your other hand to dangle an exercise band, a towel, or a similar object over your shoulder.   Grasp the band with your left / right hand so you are holding onto both ends. 3. Gently pull up on the band until you feel a stretch in the front of your left / right shoulder. ? Avoid shrugging your shoulder while you raise your arm. Keep your shoulder blade tucked down toward the middle of your back. 4. Hold for __________ seconds. 5. Release the stretch by letting go of the band and lowering your hands. Repeat __________ times.  Complete this exercise __________ times a day. Stretching Exercises  These exercises warm up your muscles and joints and improve the movement and flexibility of your shoulder. These exercises also help to relieve pain, numbness, and tingling. These exercises are done using your healthy shoulder to help stretch the muscles of your injured shoulder. Exercise E: Corner Stretch (External Rotation and Abduction) 1. Stand in a doorway with one of your feet slightly in front of the other. This is called a staggered stance. If you cannot reach your forearms to the door frame, stand facing a corner of a room. 2. Choose one of the following positions as told by your health care provider: ? Place your hands and forearms on the door frame above your head. ? Place your hands and forearms on the door frame at the height of your head. ? Place your hands on the door frame at the height of your elbows. 3. Slowly move your weight onto your front foot until you feel a stretch across your chest and in the front of your shoulders. Keep your head and chest upright and keep your abdominal muscles tight. 4. Hold for __________ seconds. 5. To release the stretch, shift your weight to your back foot. Repeat __________ times. Complete this stretch __________ times a day. Exercise F:Extension, Standing 1. Stand and hold a broomstick, a cane, or a similar object behind your back. ? Your hands should be a little wider than shoulder-width apart. ? Your palms should face away from your back. 2. Keeping your elbows straight and keeping your shoulder muscles relaxed, move the stick away from your body until you feel a stretch in your shoulder. ? Avoid shrugging your shoulders while you move the stick. Keep your shoulder blade tucked down toward the middle of your back. 3. Hold for __________ seconds. 4. Slowly return to the starting position. Repeat __________ times. Complete this exercise __________ times a  day. Strengthening Exercises           These exercises build strength and endurance in your shoulder. Endurance is the ability to use your muscles for a long time, even after they get tired. Exercise G:External Rotation 1. Sit in a stable chair without armrests. 2. Secure an exercise band at elbow height on your left / right side. 3. Place a soft object, such as a folded towel or a small pillow, between your left / right upper arm and your body to move your elbow a few inches away (about 10 cm) from your side. 4. Hold the end of the band so it is tight and there is no slack. 5. Keeping your elbow pressed against the soft object, move your left / right forearm out, away from your abdomen. Keep your body steady so only your forearm moves. 6. Hold for __________ seconds. 7. Slowly return to the starting position. Repeat __________ times. Complete this exercise __________ times a day. Exercise H:Shoulder Abduction 1. Sit in a stable chair without armrests, or stand. 2. Hold a __________ weight in your   left / right hand, or hold an exercise band with both hands. 3. Start with your arms straight down and your left / right palm facing in, toward your body. 4. Slowly lift your left / right hand out to your side. Do not lift your hand above shoulder height unless your health care provider tells you that this is safe. ? Keep your arms straight. ? Avoid shrugging your shoulder while you do this movement. Keep your shoulder blade tucked down toward the middle of your back. 5. Hold for __________ seconds. 6. Slowly lower your arm, and return to the starting position. Repeat __________ times. Complete this exercise __________ times a day. Exercise I:Shoulder Extension 1. Sit in a stable chair without armrests, or stand. 2. Secure an exercise band to a stable object in front of you where it is at shoulder height. 3. Hold one end of the exercise band in each hand. Your palms should face each  other. 4. Straighten your elbows and lift your hands up to shoulder height. 5. Step back, away from the secured end of the exercise band, until the band is tight and there is no slack. 6. Squeeze your shoulder blades together as you pull your hands down to the sides of your thighs. Stop when your hands are straight down by your sides. Do not let your hands go behind your body. 7. Hold for __________ seconds. 8. Slowly return to the starting position. Repeat __________ times. Complete this exercise __________ times a day. Exercise J:Standing Shoulder Row 1. Sit in a stable chair without armrests, or stand. 2. Secure an exercise band to a stable object in front of you so it is at waist height. 3. Hold one end of the exercise band in each hand. Your palms should be in a thumbs-up position. 4. Bend each of your elbows to an "L" shape (about 90 degrees) and keep your upper arms at your sides. 5. Step back until the band is tight and there is no slack. 6. Slowly pull your elbows back behind you. 7. Hold for __________ seconds. 8. Slowly return to the starting position. Repeat __________ times. Complete this exercise __________ times a day. Exercise K:Shoulder Press-Ups 1. Sit in a stable chair that has armrests. Sit upright, with your feet flat on the floor. 2. Put your hands on the armrests so your elbows are bent and your fingers are pointing forward. Your hands should be about even with the sides of your body. 3. Push down on the armrests and use your arms to lift yourself off of the chair. Straighten your elbows and lift yourself up as much as you comfortably can. ? Move your shoulder blades down, and avoid letting your shoulders move up toward your ears. ? Keep your feet on the ground. As you get stronger, your feet should support less of your body weight as you lift yourself up. 4. Hold for __________ seconds. 5. Slowly lower yourself back into the chair. Repeat __________ times. Complete  this exercise __________ times a day. Exercise L: Wall Push-Ups 1. Stand so you are facing a stable wall. Your feet should be about one arm-length away from the wall. 2. Lean forward and place your palms on the wall at shoulder height. 3. Keep your feet flat on the floor as you bend your elbows and lean forward toward the wall. 4. Hold for __________ seconds. 5. Straighten your elbows to push yourself back to the starting position. Repeat __________ times. Complete this exercise __________ times   a day. This information is not intended to replace advice given to you by your health care provider. Make sure you discuss any questions you have with your health care provider. Document Released: 03/20/2005 Document Revised: 09/09/2017 Document Reviewed: 01/15/2015 Elsevier Interactive Patient Education  2019 [employer]  

## 2018-06-29 NOTE — Progress Notes (Signed)
Subjective:    Patient ID: Kara Green, female    DOB: Apr 10, 1941, 78 y.o.   MRN: [id number]  HPI  Pt presents to the clinic today with multiple complaints.  1- She reports intermittent left groin pain. She reports this started a few months ago. She describes the pain as achy but can be sharp and stabbing certain movements. She denies any injury to the area. The pain does not radiate. She denies urinary or vaginal complaints. She has tried Tylenol Arthritis with minimal relief.  2- She would like a referral for dermatology. She has a scaly skin lesion on her face that she would like evaluated.  3- She has a history of chronic left shoulder pain. She would like referral to PT for dry needling.  4- She c/o left ear pain. She describes the pain as sharp and stabbing without loss of hearing. The pain radiates up her head and down her neck. She denies any drainage from the ear. She has tried Tylenol Arthritis with minimal relief.   Review of Systems      Past Medical History:  Diagnosis Date  . Allergy   . Arthritis   . Cataract   . Glaucoma   . Hypertension   . IBS (irritable bowel syndrome)   . Thyroid disease   . UTI (urinary tract infection)     Current Outpatient Medications  Medication Sig Dispense Refill  . Acetaminophen (TYLENOL ARTHRITIS PAIN PO) Take by mouth daily.    Marland Kitchen amLODipine (NORVASC) 10 MG tablet TAKE 1 TABLET BY MOUTH EVERY DAY 90 tablet 0  . Ascorbic Acid (VITAMIN C) 100 MG tablet Take 100 mg by mouth daily.    Marland Kitchen aspirin EC 81 MG tablet Take 81 mg by mouth daily.    . brimonidine (ALPHAGAN) 0.2 % ophthalmic solution Place 1 drop into the right eye 2 (two) times daily.    . Cholecalciferol (VITAMIN D) 2000 units CAPS Take 1 capsule by mouth daily.    . Cinnamon 500 MG capsule Take 500 mg by mouth daily.    . Coconut Oil 1000 MG CAPS Take by mouth daily.    . Collagen 500 MG CAPS Take by mouth daily.    . Garlic 10 MG CAPS Take by mouth daily.    Marland Kitchen  glucosamine-chondroitin 500-400 MG tablet Take 1 tablet by mouth daily.    Marland Kitchen latanoprost (XALATAN) 0.005 % ophthalmic solution Place 1 drop into both eyes at bedtime.    Marland Kitchen levothyroxine (SYNTHROID, LEVOTHROID) 125 MCG tablet TAKE 0.5 TABLETS (62.5 MCG TOTAL) BY MOUTH DAILY BEFORE BREAKFAST. 30 tablet 6  . loratadine (KLS ALLERCLEAR) 10 MG tablet Take 10 mg by mouth daily.    . Magnesium 400 MG CAPS Take by mouth daily.    . Omega-3 Fatty Acids (FISH OIL) 1200 MG CAPS Take by mouth daily.    . Prenatal Vit-Fe Fumarate-FA (PRENATAL MULTIVITAMIN) TABS tablet Take 1 tablet by mouth daily at 12 noon.    Marland Kitchen spironolactone (ALDACTONE) 25 MG tablet Take 2 tablets (50 mg total) by mouth daily. MUST SCHEDULE ANNUAL EXAM 180 tablet 0  . timolol (BETIMOL) 0.25 % ophthalmic solution Place 1-2 drops into both eyes 2 (two) times daily.    . TURMERIC PO Take 720 mg by mouth daily.    . vitamin E 100 UNIT capsule Take by mouth daily.     No current facility-administered medications for this visit.     Allergies  Allergen Reactions  . Benadryl [Diphenhydramine  Hcl]   . Codeine   . Neurontin [Gabapentin]   . Penicillins   . Sulfa Antibiotics   . Tetracyclines & Related     Family History  Problem Relation Age of Onset  . Arthritis Mother   . Breast cancer Mother        in 7280's  . Stroke Mother   . Hypertension Mother   . Hypertension Father   . Diabetes Daughter   . Diabetes Son   . Arthritis Maternal Grandmother   . Arthritis Maternal Grandfather   . Colon cancer Neg Hx   . Esophageal cancer Neg Hx   . Inflammatory bowel disease Neg Hx   . Liver disease Neg Hx   . Pancreatic cancer Neg Hx   . Rectal cancer Neg Hx   . Stomach cancer Neg Hx     Social History   Socioeconomic History  . Marital status: Divorced    Spouse name: Not on file  . Number of children: 3  . Years of education: Not on file  . Highest education level: Not on file  Occupational History  . Occupation: retired    Engineer, productionocial Needs  . Financial resource strain: Not on file  . Food insecurity:    Worry: Not on file    Inability: Not on file  . Transportation needs:    Medical: Not on file    Non-medical: Not on file  Tobacco Use  . Smoking status: Former Games developermoker  . Smokeless tobacco: Never Used  . Tobacco comment: quit 25 years ago  Substance and Sexual Activity  . Alcohol use: Yes    Alcohol/week: 7.0 standard drinks    Types: 7 Glasses of wine per week    Comment: with dinner  . Drug use: No  . Sexual activity: Not Currently  Lifestyle  . Physical activity:    Days per week: Not on file    Minutes per session: Not on file  . Stress: Not on file  Relationships  . Social connections:    Talks on phone: Not on file    Gets together: Not on file    Attends religious service: Not on file    Active member of club or organization: Not on file    Attends meetings of clubs or organizations: Not on file    Relationship status: Not on file  . Intimate partner violence:    Fear of current or ex partner: Not on file    Emotionally abused: Not on file    Physically abused: Not on file    Forced sexual activity: Not on file  Other Topics Concern  . Not on file  Social History Narrative  . Not on file     Constitutional: Denies fever, malaise, fatigue, headache or abrupt weight changes.  HEENT: Pt reports left ear pain. Denies eye pain, eye redness, ringing in the ears, wax buildup, runny nose, nasal congestion, bloody nose, or sore throat. Respiratory: Denies difficulty breathing, shortness of breath, cough or sputum production.   Cardiovascular: Denies chest pain, chest tightness, palpitations or swelling in the hands or feet.  Gastrointestinal: Denies abdominal pain, bloating, constipation, diarrhea or blood in the stool.  GU: Denies urgency, frequency, pain with urination, burning sensation, blood in urine, odor or discharge. Musculoskeletal: Pt reports pubic pain, left groin pain, chronic  left shoulder pain. Denies decrease in range of motion, difficulty with gait, muscle pain or joint swelling.  Skin: Pt reports skin lesion of face. Denies redness, rashes, or  ulcercations.   No other specific complaints in a complete review of systems (except as listed in HPI above).  Objective:   Physical Exam   BP 124/68   Pulse 74   Temp 97.9 F (36.6 C) (Oral)   Wt 131 lb (59.4 kg)   SpO2 97%   BMI 23.96 kg/m  Wt Readings from Last 3 Encounters:  06/29/18 131 lb (59.4 kg)  03/19/18 126 lb (57.2 kg)  02/05/18 128 lb 2 oz (58.1 kg)    General: Appears her stated age, well developed, well nourished in NAD. Skin: Warm, dry and intact. Scaly area noted on left cheek. HEENT: Ears: Tm's gray and intact, normal light reflex;  Cardiovascular: Normal rate and rhythm.  Pulmonary/Chest: Normal effort and positive vesicular breath sounds. No respiratory distress. No wheezes, rales or ronchi noted.  Abdomen: Soft and nontender. Normal bowel sounds. No distention or masses noted.  Musculoskeletal: Normal internal and external rotation of the left hip. Pain with palpation over the pubic bone. Decreased external rotation of the left shoulder. Normal internal rotation of the left shoulder. No difficulty with gait. Neurological: Alert and oriented.    BMET    Component Value Date/Time   NA 135 11/10/2017 1436   K 4.1 11/10/2017 1436   CL 97 11/10/2017 1436   CO2 30 11/10/2017 1436   GLUCOSE 96 11/10/2017 1436   BUN 18 11/10/2017 1436   CREATININE 0.84 11/10/2017 1436   CALCIUM 9.8 11/10/2017 1436    Lipid Panel     Component Value Date/Time   CHOL 239 (H) 08/06/2017 1504   TRIG 77.0 08/06/2017 1504   HDL 120.90 08/06/2017 1504   CHOLHDL 2 08/06/2017 1504   VLDL 15.4 08/06/2017 1504   LDLCALC 103 (H) 08/06/2017 1504    CBC    Component Value Date/Time   WBC 6.0 02/05/2018 1546   RBC 4.55 02/05/2018 1546   HGB 15.2 (H) 02/05/2018 1546   HCT 45.1 02/05/2018 1546   PLT  222.0 02/05/2018 1546   MCV 99.1 02/05/2018 1546   MCHC 33.8 02/05/2018 1546   RDW 13.0 02/05/2018 1546    Hgb A1C No results found for: HGBA1C         Assessment & Plan:    Chronic Left Shoulder Pain:  Referral to PT for dry needling per pt request  Skin Lesion of Face:  Referral to derm per pt request  Left Ear Pain:  Exam benign Try Ibuprofen 400 mg BID prn with food Try Flonase 1 spray daily  Pubic Bone Pain/Left Groin Pain:  Xray left hip with pelvis  Return precautions discussed Nicki Reaper, NP

## 2018-07-15 DIAGNOSIS — M25511 Pain in right shoulder: Secondary | ICD-10-CM | POA: Diagnosis not present

## 2018-07-15 DIAGNOSIS — M25512 Pain in left shoulder: Secondary | ICD-10-CM | POA: Diagnosis not present

## 2018-07-15 DIAGNOSIS — R102 Pelvic and perineal pain: Secondary | ICD-10-CM | POA: Diagnosis not present

## 2018-07-16 DIAGNOSIS — R509 Fever, unspecified: Secondary | ICD-10-CM | POA: Diagnosis not present

## 2018-07-16 DIAGNOSIS — R05 Cough: Secondary | ICD-10-CM | POA: Diagnosis not present

## 2018-07-16 DIAGNOSIS — R52 Pain, unspecified: Secondary | ICD-10-CM | POA: Diagnosis not present

## 2018-07-16 DIAGNOSIS — J101 Influenza due to other identified influenza virus with other respiratory manifestations: Secondary | ICD-10-CM | POA: Diagnosis not present

## 2018-07-21 DIAGNOSIS — M25512 Pain in left shoulder: Secondary | ICD-10-CM | POA: Diagnosis not present

## 2018-07-21 DIAGNOSIS — R102 Pelvic and perineal pain: Secondary | ICD-10-CM | POA: Diagnosis not present

## 2018-07-21 DIAGNOSIS — M25511 Pain in right shoulder: Secondary | ICD-10-CM | POA: Diagnosis not present

## 2018-07-29 DIAGNOSIS — M25511 Pain in right shoulder: Secondary | ICD-10-CM | POA: Diagnosis not present

## 2018-07-29 DIAGNOSIS — R102 Pelvic and perineal pain: Secondary | ICD-10-CM | POA: Diagnosis not present

## 2018-07-29 DIAGNOSIS — M25512 Pain in left shoulder: Secondary | ICD-10-CM | POA: Diagnosis not present

## 2018-08-12 ENCOUNTER — Other Ambulatory Visit: Payer: Self-pay | Admitting: Internal Medicine

## 2018-08-18 ENCOUNTER — Telehealth: Payer: Self-pay | Admitting: Internal Medicine

## 2018-08-18 NOTE — Telephone Encounter (Signed)
Pt need refill for spironolactone 25mg     Sent to [facility]

## 2018-08-25 MED ORDER — SPIRONOLACTONE 25 MG PO TABS: 50.0000 mg | ORAL_TABLET | Freq: Every day | ORAL | 0 refills | Status: DC

## 2018-08-25 NOTE — Telephone Encounter (Signed)
Rx sent through [facility]  

## 2018-08-26 ENCOUNTER — Telehealth: Payer: Self-pay | Admitting: *Deleted

## 2018-08-26 NOTE — Telephone Encounter (Signed)
Can we see if she can do a doxy.me with help from son/daughter if needed?

## 2018-08-26 NOTE — Telephone Encounter (Signed)
Patient called stating that she has had 6 ticks embedded in her skin, but was able to get them out. Patient stated that the pharmacist told her that she probably should be on an antibiotic and wants to know if Nicki Reaper NP will prescribe it for her?

## 2018-08-27 ENCOUNTER — Other Ambulatory Visit: Payer: Self-pay

## 2018-08-27 ENCOUNTER — Ambulatory Visit: Payer: Self-pay

## 2018-08-27 ENCOUNTER — Ambulatory Visit
Admission: EM | Admit: 2018-08-27 | Discharge: 2018-08-27 | Disposition: A | Discharge status: Home / Self Care | Payer: Medicare Other | Attending: Family Medicine | Admitting: Family Medicine

## 2018-08-27 ENCOUNTER — Encounter: Payer: Self-pay | Admitting: Emergency Medicine

## 2018-08-27 DIAGNOSIS — W57XXXA Bitten or stung by nonvenomous insect and other nonvenomous arthropods, initial encounter: Secondary | ICD-10-CM | POA: Diagnosis not present

## 2018-08-27 DIAGNOSIS — Y93H2 Activity, gardening and landscaping: Secondary | ICD-10-CM

## 2018-08-27 DIAGNOSIS — T148XXA Other injury of unspecified body region, initial encounter: Secondary | ICD-10-CM | POA: Diagnosis not present

## 2018-08-27 DIAGNOSIS — L03114 Cellulitis of left upper limb: Secondary | ICD-10-CM | POA: Diagnosis not present

## 2018-08-27 MED ORDER — TRIAMCINOLONE ACETONIDE 0.1 % EX CREA: 1.0000 "application " | TOPICAL_CREAM | Freq: Two times a day (BID) | CUTANEOUS | 0 refills | Status: AC

## 2018-08-27 MED ORDER — CEPHALEXIN 500 MG PO CAPS: 500.0000 mg | ORAL_CAPSULE | Freq: Three times a day (TID) | ORAL | 0 refills | Status: AC

## 2018-08-27 NOTE — Telephone Encounter (Signed)
Patient called and says she's been bitten by at least 8 ticks that she removed and some were embedded. She says she was told by the pharmacist that she will need to be on an antibiotic. She says she called yesterday and has been waiting on a call back from [place]. She says on her arms is redness and puffy area where a tic was and she says she has a headache, no fever. I advised the office is closed now and that she will need to be seen in the UC for evaluation. I advised to go to UC at [facility], which is closer to her home. She verbalized understanding and says she will go today.  Reason for Disposition . [1] Red or very tender (to touch) area AND [2] started over 24 hours after the bite  Answer Assessment - Initial Assessment Questions 1. TYPE of TICK: "Is it a wood tick or a deer tick?" If unsure, ask: "What size was the tick?" "Did it look more like a watermelon seed or a poppy seed?"      No idea 2. LOCATION: "Where is the tick bite located?"      Right hip, abdomen, left/right arm pits, right calf, left foot, right lower leg 3. ONSET: "How long do you think the tick was attached before you removed it?" (Hours or days)      I don't know, probably several days 4. TETANUS: "When was the last tetanus booster?"      Not 10 years 5. PREGNANCY: "Is there any chance you are pregnant?" "When was your last menstrual period?"     No  Protocols used: TICK BITE-A-AH

## 2018-08-27 NOTE — ED Triage Notes (Signed)
Pt presents to [facility] for assessment of multiple tick bites, noting the first approx 1 week ago to her right hip.  Patient states she's found 3 or 4 total, but is unsure if she has anymore.  Second one was to abdomen, one to right upper arm and left upper arm.

## 2018-08-27 NOTE — ED Provider Notes (Signed)
[facility]    CSN: [id number] Arrival date & time: 08/27/18  1727     History   Chief Complaint Chief Complaint  Patient presents with  . Insect Bite    HPI Kara Green is a 78 y.o. female.   HPI  Patient presents today with a concern of tick bites. She reports over the course of one week working outside in her yard and being bitten by multiple ticks that she personally removed. She came to urgent care concern for worsening lesion on left upper arm where she recently removed a tick. The affected area is red, warm, itching, and tender to touch. She has applied antibiotic ointment to all the sites which have healed and scabbed over with the exception of the left upper arm lesion. She denies nausea, chills, fever, changes in activity, or body aches.  Past Medical History:  Diagnosis Date  . Allergy   . Arthritis   . Cataract   . Glaucoma   . Hypertension   . IBS (irritable bowel syndrome)   . Thyroid disease   . UTI (urinary tract infection)     Patient Active Problem List   Diagnosis Date Noted  . Lower abdominal pain 02/09/2018  . Bloating symptom 02/09/2018  . Dysphagia 02/09/2018  . Osteoarthritis 01/01/2017  . Glaucoma 01/01/2017  . HTN (hypertension) 01/01/2017  . Hypothyroidism 01/01/2017    Past Surgical History:  Procedure Laterality Date  . ABDOMINAL HYSTERECTOMY  1981   total  . APPENDECTOMY  1981  . cataracts Bilateral   . HERNIA REPAIR  1980  . TONSILLECTOMY AND ADENOIDECTOMY      OB History   No obstetric history on file.      Home Medications    Prior to Admission medications   Medication Sig Start Date End Date Taking? Authorizing Provider  Acetaminophen (TYLENOL ARTHRITIS PAIN PO) Take by mouth daily.    [provider]  amLODipine (NORVASC) 10 MG tablet TAKE 1 TABLET BY MOUTH EVERY DAY 08/13/18   Lorre Munroe, NP  Ascorbic Acid (VITAMIN C) 100 MG tablet Take 100 mg by mouth daily.    [provider]  aspirin EC 81 MG tablet Take 81 mg by mouth daily.    [provider]  brimonidine (ALPHAGAN) 0.2 % ophthalmic solution Place 1 drop into the right eye 2 (two) times daily.    [provider]  Cholecalciferol (VITAMIN D) 2000 units CAPS Take 1 capsule by mouth daily.    [provider]  Cinnamon 500 MG capsule Take 500 mg by mouth daily.    [provider]  Coconut Oil 1000 MG CAPS Take by mouth daily.    [provider]  Collagen 500 MG CAPS Take by mouth daily.    [provider]  Garlic 10 MG CAPS Take by mouth daily.    [provider]  glucosamine-chondroitin 500-400 MG tablet Take 1 tablet by mouth daily.    [provider]  latanoprost (XALATAN) 0.005 % ophthalmic solution Place 1 drop into both eyes at bedtime.    [provider]  levothyroxine (SYNTHROID, LEVOTHROID) 125 MCG tablet TAKE 0.5 TABLETS (62.5 MCG TOTAL) BY MOUTH DAILY BEFORE BREAKFAST. 11/06/17   Lorre Munroe, NP  loratadine (KLS ALLERCLEAR) 10 MG tablet Take 10 mg by mouth daily.    [provider]  Magnesium 400 MG CAPS Take by mouth daily.    [provider]  Omega-3 Fatty Acids (FISH OIL) 1200  MG CAPS Take by mouth daily.    [provider]  Prenatal Vit-Fe Fumarate-FA (PRENATAL MULTIVITAMIN) TABS tablet Take 1 tablet by mouth daily at 12 noon.    [provider]  spironolactone (ALDACTONE) 25 MG tablet Take 2 tablets (50 mg total) by mouth daily. MUST SCHEDULE ANNUAL EXAM 08/25/18   Lorre Munroe, NP  timolol (BETIMOL) 0.25 % ophthalmic solution Place 1-2 drops into both eyes 2 (two) times daily.    [provider]  TURMERIC PO Take 720 mg by mouth daily.    [provider]  vitamin E 100 UNIT capsule Take by mouth daily.    [provider]    Family History Family History  Problem Relation Age of Onset  . Arthritis Mother   . Breast cancer Mother        in 43's  .  Stroke Mother   . Hypertension Mother   . Hypertension Father   . Diabetes Daughter   . Diabetes Son   . Arthritis Maternal Grandmother   . Arthritis Maternal Grandfather   . Colon cancer Neg Hx   . Esophageal cancer Neg Hx   . Inflammatory bowel disease Neg Hx   . Liver disease Neg Hx   . Pancreatic cancer Neg Hx   . Rectal cancer Neg Hx   . Stomach cancer Neg Hx     Social History Social History   Tobacco Use  . Smoking status: Former Games developer  . Smokeless tobacco: Never Used  . Tobacco comment: quit 25 years ago  Substance Use Topics  . Alcohol use: Yes    Alcohol/week: 7.0 standard drinks    Types: 7 Glasses of wine per week    Comment: with dinner  . Drug use: No     Allergies   Benadryl [diphenhydramine hcl]; Codeine; Neurontin [gabapentin]; Penicillins; Sulfa antibiotics; and Tetracyclines & related   Review of Systems Review of Systems Pertinent negatives listed in HPI Physical Exam Triage Vital Signs ED Triage Vitals  Enc Vitals Group     BP 08/27/18 1738 (!) 163/84     Pulse Rate 08/27/18 1738 82     Resp 08/27/18 1738 18     Temp 08/27/18 1738 98 F (36.7 C)     Temp Source 08/27/18 1738 Oral     SpO2 08/27/18 1738 94 %     Weight --      Height --      Head Circumference --      Peak Flow --      Pain Score 08/27/18 1739 0     Pain Loc --      Pain Edu? --      Excl. in GC? --    Updated Vital Signs BP (!) 163/84 (BP Location: Right Arm)   Pulse 82   Temp 98 F (36.7 C) (Oral)   Resp 18   SpO2 94%   Visual Acuity Right Eye Distance:   Left Eye Distance:   Bilateral Distance:    Right Eye Near:   Left Eye Near:    Bilateral Near:     Physical Exam Constitutional:      Appearance: She is not ill-appearing or toxic-appearing.  HENT:     Head: Normocephalic and atraumatic.  Eyes:     Pupils: Pupils are equal, round, and reactive to light.  Neck:     Musculoskeletal: Normal range of motion.  Cardiovascular:     Rate and  Rhythm: Normal rate and regular  rhythm.  Pulmonary:     Effort: Pulmonary effort is normal.     Breath sounds: Normal breath sounds.  Skin:    General: Skin is warm.     Capillary Refill: Capillary refill takes less than 2 seconds.     Findings: Erythema, lesion and rash present. Rash is crusting and papular.          Comments: Healed non-papular lesions present on multiple areas of the skin. Left upper arm lesion with surrounding/ extending erythema distally down extremity. Warm to touch.   Neurological:     Mental Status: She is alert.    UC Treatments / Results  Labs (all labs ordered are listed, but only abnormal results are displayed) Labs Reviewed - No data to display  EKG None  Radiology No results found.  Procedures Procedures (including critical care time)  Medications Ordered in UC Medications - No data to display  Initial Impression / Assessment and Plan / UC Course  I have reviewed the triage vital signs and the nursing notes.  Pertinent labs & imaging results that were available during my care of the patient were reviewed by me and considered in my medical decision making (see chart for details).   Pleasant 78 year old female presents after sustaining multiple insect bites (pt reports ticks) while performing yard work over one week ago. On exam, other lesions are healed and no evidence of infection with the exception of left upper arm is significant for cellulitis infection secondary to insect bite. Patient has multiple allergies to antibiotics and is unable to tolerated Doxycyline. She reports tolerating Keflex in the past. Will treat cellulitis with Keflex 500 mg TID x 7 days. Patient educated regarding inspecting her skin by removing all clothing after returning from outside. Recommended Deets insect repellant use to reduce the risk of tick or any other insect bites. Patient verbalized understanding and agreement with plan.  Final Clinical Impressions(s) / UC  Diagnoses   Final diagnoses:  Tick bite, initial encounter  Cellulitis of left upper extremity   Discharge Instructions   None    ED Prescriptions    Medication Sig Dispense Auth. Provider   cephALEXin (KEFLEX) 500 MG capsule Take 1 capsule (500 mg total) by mouth 3 (three) times daily for 7 days. 20 capsule Bing NeighborsHarris, Jesselee Poth S, FNP   triamcinolone cream (KENALOG) 0.1 % Apply 1 application topically 2 (two) times daily. 30 g Bing NeighborsHarris, Kordell Jafri S, FNP     Controlled Substance Prescriptions [place] Controlled Substance Registry consulted? Not Applicable   Bing NeighborsHarris, Garnett Rekowski S, FNP 08/27/18 1925

## 2018-08-27 NOTE — ED Notes (Signed)
Patient able to ambulate independently  

## 2018-08-31 NOTE — Telephone Encounter (Signed)
Per chart review pt went to [facility] on 08/27/18.

## 2018-08-31 NOTE — Telephone Encounter (Signed)
Can we schedule UC Webex followup

## 2018-11-02 DIAGNOSIS — H401113 Primary open-angle glaucoma, right eye, severe stage: Secondary | ICD-10-CM | POA: Diagnosis not present

## 2018-11-02 DIAGNOSIS — Z961 Presence of intraocular lens: Secondary | ICD-10-CM | POA: Diagnosis not present

## 2018-11-02 DIAGNOSIS — H401122 Primary open-angle glaucoma, left eye, moderate stage: Secondary | ICD-10-CM | POA: Diagnosis not present

## 2018-11-12 ENCOUNTER — Other Ambulatory Visit: Payer: Self-pay | Admitting: Internal Medicine

## 2018-12-10 ENCOUNTER — Other Ambulatory Visit: Payer: Self-pay | Admitting: Internal Medicine

## 2018-12-16 DIAGNOSIS — Z961 Presence of intraocular lens: Secondary | ICD-10-CM | POA: Diagnosis not present

## 2018-12-16 DIAGNOSIS — H401113 Primary open-angle glaucoma, right eye, severe stage: Secondary | ICD-10-CM | POA: Diagnosis not present

## 2018-12-16 DIAGNOSIS — H401122 Primary open-angle glaucoma, left eye, moderate stage: Secondary | ICD-10-CM | POA: Diagnosis not present

## 2018-12-21 ENCOUNTER — Other Ambulatory Visit: Payer: Self-pay

## 2019-01-06 DIAGNOSIS — L718 Other rosacea: Secondary | ICD-10-CM | POA: Diagnosis not present

## 2019-01-06 DIAGNOSIS — L308 Other specified dermatitis: Secondary | ICD-10-CM | POA: Diagnosis not present

## 2019-01-06 DIAGNOSIS — L570 Actinic keratosis: Secondary | ICD-10-CM | POA: Diagnosis not present

## 2019-01-06 DIAGNOSIS — L710 Perioral dermatitis: Secondary | ICD-10-CM | POA: Diagnosis not present

## 2019-01-11 ENCOUNTER — Other Ambulatory Visit: Payer: Self-pay | Admitting: Internal Medicine

## 2019-01-28 DIAGNOSIS — H401113 Primary open-angle glaucoma, right eye, severe stage: Secondary | ICD-10-CM | POA: Diagnosis not present

## 2019-02-09 ENCOUNTER — Other Ambulatory Visit: Payer: Self-pay | Admitting: Internal Medicine

## 2019-02-10 ENCOUNTER — Other Ambulatory Visit: Payer: Self-pay | Admitting: Internal Medicine

## 2019-02-11 ENCOUNTER — Encounter: Payer: Self-pay | Admitting: Internal Medicine

## 2019-02-11 ENCOUNTER — Ambulatory Visit (INDEPENDENT_AMBULATORY_CARE_PROVIDER_SITE_OTHER): Payer: Medicare Other | Admitting: Internal Medicine

## 2019-02-11 ENCOUNTER — Other Ambulatory Visit: Payer: Self-pay

## 2019-02-11 ENCOUNTER — Ambulatory Visit: Payer: Medicare Other | Admitting: Internal Medicine

## 2019-02-11 ENCOUNTER — Ambulatory Visit: Payer: Medicare Other | Admitting: Family Medicine

## 2019-02-11 VITALS — BP 122/70 | HR 68 | Temp 98.2°F | Wt 130.0 lb

## 2019-02-11 DIAGNOSIS — E039 Hypothyroidism, unspecified: Secondary | ICD-10-CM

## 2019-02-11 DIAGNOSIS — I10 Essential (primary) hypertension: Secondary | ICD-10-CM | POA: Diagnosis not present

## 2019-02-11 DIAGNOSIS — H9202 Otalgia, left ear: Secondary | ICD-10-CM | POA: Diagnosis not present

## 2019-02-11 NOTE — Assessment & Plan Note (Signed)
TSH and Free T4 today Will adjust and refill Levothyroxine if needed based on labs 

## 2019-02-11 NOTE — Assessment & Plan Note (Signed)
Controlled on Amlodipine and Spironolactone Reinforced DASH diet BMET today

## 2019-02-11 NOTE — Progress Notes (Signed)
Subjective:    Patient ID: Skila Rollins, female    DOB: 01/11/1941, 78 y.o.   MRN: [id number]  HPI  Pt presents to the clinic today for follow up of chronic conditions.  HTN: Her BP today is 122/70. She is taking Amlodipine and Spironolactone as prescribed. ECG from 08/2017 reviewed  Hypothyroidism: She denies any issues on her current dose of Levothyroxine. She is due for repeat labs today.  She also c/o left ear pain. This started 20 years ago but reoccurred 4-5 days ago. It is intermittent. She describes sharp, shooting, burning pain in the tip of the ear. She denies hearing loss associated with this. She reports runny nose associated with allergies .She has not seen an ENT in the past for this.  Review of Systems      Past Medical History:  Diagnosis Date  . Allergy   . Arthritis   . Cataract   . Glaucoma   . Hypertension   . IBS (irritable bowel syndrome)   . Thyroid disease   . UTI (urinary tract infection)     Current Outpatient Medications  Medication Sig Dispense Refill  . Acetaminophen (TYLENOL ARTHRITIS PAIN PO) Take by mouth daily.    Marland Kitchen amLODipine (NORVASC) 10 MG tablet TAKE 1 TABLET (10 MG TOTAL) BY MOUTH DAILY. MUST SCHEDULE PHYSICAL 30 tablet 0  . Ascorbic Acid (VITAMIN C) 100 MG tablet Take 100 mg by mouth daily.    Marland Kitchen aspirin EC 81 MG tablet Take 81 mg by mouth daily.    . brimonidine (ALPHAGAN) 0.2 % ophthalmic solution Place 1 drop into the right eye 2 (two) times daily.    . Cholecalciferol (VITAMIN D) 2000 units CAPS Take 1 capsule by mouth daily.    . Cinnamon 500 MG capsule Take 500 mg by mouth daily.    . Coconut Oil 1000 MG CAPS Take by mouth daily.    . Collagen 500 MG CAPS Take by mouth daily.    . Garlic 10 MG CAPS Take by mouth daily.    Marland Kitchen glucosamine-chondroitin 500-400 MG tablet Take 1 tablet by mouth daily.    Marland Kitchen latanoprost (XALATAN) 0.005 % ophthalmic solution Place 1 drop into both eyes at bedtime.    Marland Kitchen levothyroxine (SYNTHROID) 125  MCG tablet TAKE 0.5 TABLETS (62.5 MCG TOTAL) BY MOUTH DAILY BEFORE BREAKFAST. 45 tablet 0  . loratadine (KLS ALLERCLEAR) 10 MG tablet Take 10 mg by mouth daily.    . Magnesium 400 MG CAPS Take by mouth daily.    . Omega-3 Fatty Acids (FISH OIL) 1200 MG CAPS Take by mouth daily.    . Prenatal Vit-Fe Fumarate-FA (PRENATAL MULTIVITAMIN) TABS tablet Take 1 tablet by mouth daily at 12 noon.    Marland Kitchen spironolactone (ALDACTONE) 25 MG tablet TAKE 2 TABLETS (50 MG TOTAL) BY MOUTH DAILY. MUST SCHEDULE ANNUAL EXAM 60 tablet 0  . timolol (BETIMOL) 0.25 % ophthalmic solution Place 1-2 drops into both eyes 2 (two) times daily.    Marland Kitchen triamcinolone cream (KENALOG) 0.1 % Apply 1 application topically 2 (two) times daily. 30 g 0  . TURMERIC PO Take 720 mg by mouth daily.    . vitamin E 100 UNIT capsule Take by mouth daily.     No current facility-administered medications for this visit.     Allergies  Allergen Reactions  . Benadryl [Diphenhydramine Hcl]     hyperactive  . Codeine   . Neurontin [Gabapentin]     unknown  . Penicillins   .  Sulfa Antibiotics   . Tetracyclines & Related     Family History  Problem Relation Age of Onset  . Arthritis Mother   . Breast cancer Mother        in 5880's  . Stroke Mother   . Hypertension Mother   . Hypertension Father   . Diabetes Daughter   . Diabetes Son   . Arthritis Maternal Grandmother   . Arthritis Maternal Grandfather   . Colon cancer Neg Hx   . Esophageal cancer Neg Hx   . Inflammatory bowel disease Neg Hx   . Liver disease Neg Hx   . Pancreatic cancer Neg Hx   . Rectal cancer Neg Hx   . Stomach cancer Neg Hx     Social History   Socioeconomic History  . Marital status: Divorced    Spouse name: Not on file  . Number of children: 3  . Years of education: Not on file  . Highest education level: Not on file  Occupational History  . Occupation: retired  Engineer, productionocial Needs  . Financial resource strain: Not on file  . Food insecurity    Worry: Not  on file    Inability: Not on file  . Transportation needs    Medical: Not on file    Non-medical: Not on file  Tobacco Use  . Smoking status: Former Games developermoker  . Smokeless tobacco: Never Used  . Tobacco comment: quit 25 years ago  Substance and Sexual Activity  . Alcohol use: Yes    Alcohol/week: 7.0 standard drinks    Types: 7 Glasses of wine per week    Comment: with dinner  . Drug use: No  . Sexual activity: Not Currently  Lifestyle  . Physical activity    Days per week: Not on file    Minutes per session: Not on file  . Stress: Not on file  Relationships  . Social Musicianconnections    Talks on phone: Not on file    Gets together: Not on file    Attends religious service: Not on file    Active member of club or organization: Not on file    Attends meetings of clubs or organizations: Not on file    Relationship status: Not on file  . Intimate partner violence    Fear of current or ex partner: Not on file    Emotionally abused: Not on file    Physically abused: Not on file    Forced sexual activity: Not on file  Other Topics Concern  . Not on file  Social History Narrative  . Not on file     Constitutional: Denies fever, malaise, fatigue, headache or abrupt weight changes.  HEENT: Pt reports left ear pain. Denies eye pain, eye redness, ringing in the ears, wax buildup, runny nose, nasal congestion, bloody nose, or sore throat. Respiratory: Denies difficulty breathing, shortness of breath, cough or sputum production.   Cardiovascular: Denies chest pain, chest tightness, palpitations or swelling in the hands or feet.   No other specific complaints in a complete review of systems (except as listed in HPI above).  Objective:   Physical Exam   BP 122/70   Pulse 68   Temp 98.2 F (36.8 C) (Temporal)   Wt 130 lb (59 kg)   SpO2 97%   BMI 23.78 kg/m  Wt Readings from Last 3 Encounters:  02/11/19 130 lb (59 kg)  06/29/18 131 lb (59.4 kg)  03/19/18 126 lb (57.2 kg)     General:  Appears her stated age, well developed, well nourished in NAD. Skin: Warm, dry and intact. No rashes, lesions or ulcerations noted. HEENT: Head: normal shape and size; Ears: Left external ear canal slightly red, warm and tender to touch. Tm's gray and intact, normal light reflex;  Neck:  Neck supple, trachea midline. No masses, lumps or thyromegaly present.  Cardiovascular: Normal rate and rhythm. S1,S2 noted.  No murmur, rubs or gallops noted. No JVD or BLE edema.  Pulmonary/Chest: Normal effort and positive vesicular breath sounds. No respiratory distress. No wheezes, rales or ronchi noted.  Neurological: Alert and oriented.    BMET    Component Value Date/Time   NA 135 11/10/2017 1436   K 4.1 11/10/2017 1436   CL 97 11/10/2017 1436   CO2 30 11/10/2017 1436   GLUCOSE 96 11/10/2017 1436   BUN 18 11/10/2017 1436   CREATININE 0.84 11/10/2017 1436   CALCIUM 9.8 11/10/2017 1436    Lipid Panel     Component Value Date/Time   CHOL 239 (H) 08/06/2017 1504   TRIG 77.0 08/06/2017 1504   HDL 120.90 08/06/2017 1504   CHOLHDL 2 08/06/2017 1504   VLDL 15.4 08/06/2017 1504   LDLCALC 103 (H) 08/06/2017 1504    CBC    Component Value Date/Time   WBC 6.0 02/05/2018 1546   RBC 4.55 02/05/2018 1546   HGB 15.2 (H) 02/05/2018 1546   HCT 45.1 02/05/2018 1546   PLT 222.0 02/05/2018 1546   MCV 99.1 02/05/2018 1546   MCHC 33.8 02/05/2018 1546   RDW 13.0 02/05/2018 1546    Hgb A1C No results found for: HGBA1C         Assessment & Plan:   Otalgia, Left Ear:  Offered Depo Medrol 80 mg IM, she declines as she is concerned this will raise her intraocular pressure Encouraged Ibuprofen 400 mg every 8 hours as needed with food Encourage cool compresses TID Referral to ENT placed  Will follow up after labs, return precautions discussed Nicki Reaper, NP

## 2019-02-12 LAB — BASIC METABOLIC PANEL
BUN: 13 mg/dL (ref 6–23)
CO2: 27 mEq/L (ref 19–32)
Calcium: 10.4 mg/dL (ref 8.4–10.5)
Chloride: 93 mEq/L — ABNORMAL LOW (ref 96–112)
Creatinine, Ser: 0.79 mg/dL (ref 0.40–1.20)
GFR: 69.92 mL/min (ref >60.00)
Glucose, Bld: 89 mg/dL (ref 70–99)
Potassium: 4.1 mEq/L (ref 3.5–5.1)
Sodium: 134 mEq/L — ABNORMAL LOW (ref 135–145)

## 2019-02-12 LAB — T4, FREE: Free T4: 1.66 ng/dL — ABNORMAL HIGH (ref 0.60–1.60)

## 2019-02-12 LAB — TSH: TSH: 1.83 u[IU]/mL (ref 0.35–4.50)

## 2019-02-22 DIAGNOSIS — Z23 Encounter for immunization: Secondary | ICD-10-CM | POA: Diagnosis not present

## 2019-03-07 ENCOUNTER — Other Ambulatory Visit: Payer: Self-pay | Admitting: Internal Medicine

## 2019-03-08 DIAGNOSIS — R6884 Jaw pain: Secondary | ICD-10-CM | POA: Diagnosis not present

## 2019-03-08 DIAGNOSIS — H9202 Otalgia, left ear: Secondary | ICD-10-CM | POA: Diagnosis not present

## 2019-03-09 ENCOUNTER — Other Ambulatory Visit: Payer: Self-pay | Admitting: Internal Medicine

## 2019-03-16 ENCOUNTER — Other Ambulatory Visit: Payer: Self-pay | Admitting: Internal Medicine

## 2019-03-17 DIAGNOSIS — H401113 Primary open-angle glaucoma, right eye, severe stage: Secondary | ICD-10-CM | POA: Diagnosis not present

## 2019-03-17 DIAGNOSIS — H401122 Primary open-angle glaucoma, left eye, moderate stage: Secondary | ICD-10-CM | POA: Diagnosis not present

## 2019-03-17 DIAGNOSIS — Z961 Presence of intraocular lens: Secondary | ICD-10-CM | POA: Diagnosis not present

## 2019-03-17 DIAGNOSIS — H353132 Nonexudative age-related macular degeneration, bilateral, intermediate dry stage: Secondary | ICD-10-CM | POA: Diagnosis not present

## 2019-04-03 ENCOUNTER — Other Ambulatory Visit: Payer: Self-pay | Admitting: Internal Medicine

## 2019-04-10 ENCOUNTER — Other Ambulatory Visit: Payer: Self-pay | Admitting: Internal Medicine

## 2019-04-13 ENCOUNTER — Other Ambulatory Visit: Payer: Self-pay

## 2019-04-26 IMAGING — RF DG ESOPHAGUS
9 series · 17 of 24 positions shown · non-contrast
Comparison: None.

CLINICAL DATA: Difficulty swallowing, dysphagia

EXAM:
ESOPHOGRAM / BARIUM SWALLOW / BARIUM TABLET STUDY
TECHNIQUE: Combined double contrast and single contrast examination performed
using effervescent crystals, thick barium liquid, and thin barium
liquid. The patient was observed with fluoroscopy swallowing a 13 mm
barium sulphate tablet.
FLUOROSCOPY TIME:  Fluoroscopy Time:  2 minutes 12 seconds
Radiation Exposure Index (if provided by the fluoroscopic device):
19.4 mGy
Number of Acquired Spot Images: 0

[Series 1: cp_standard · 0.38mm/px · 2 of 74 frames shown (1 of 9)]
[frame 12/74]
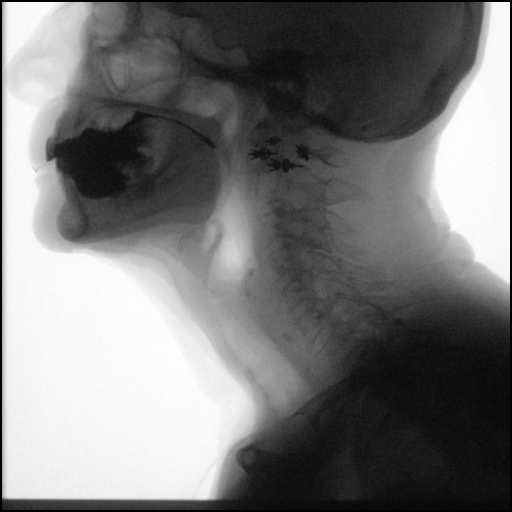
[frame 63/74]
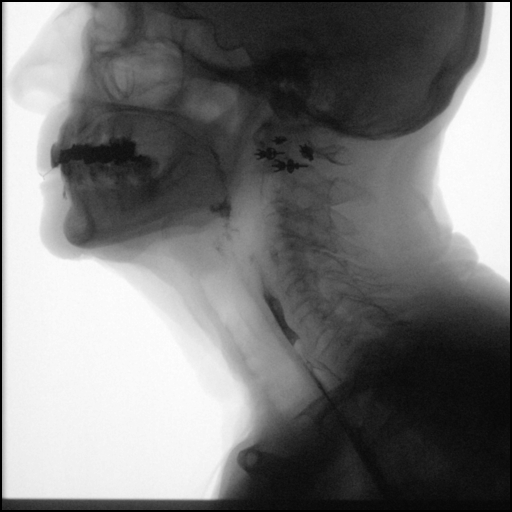

[Series 2: cp_standard · 0.34mm/px · 2 of 488 frames shown (2 of 9)]
[frame 74/488]
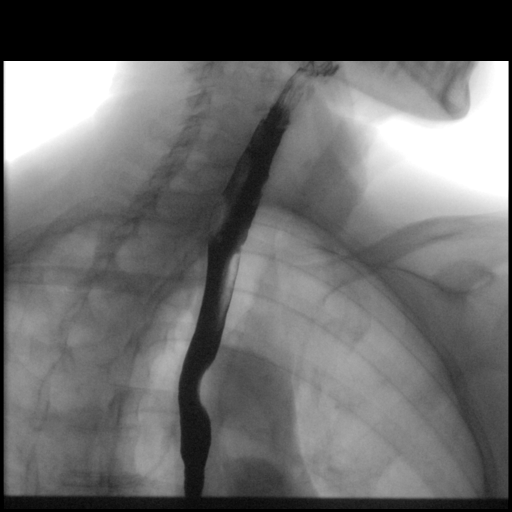
[frame 356/488]
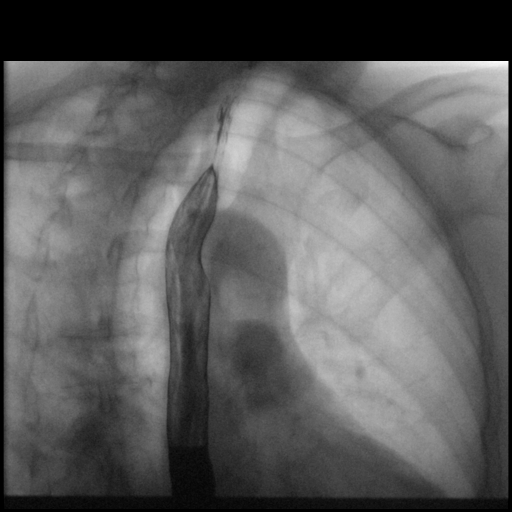

[Series 3: cp_standard · 0.36mm/px · 2 of 104 frames shown (3 of 9)]
[frame 9/104]
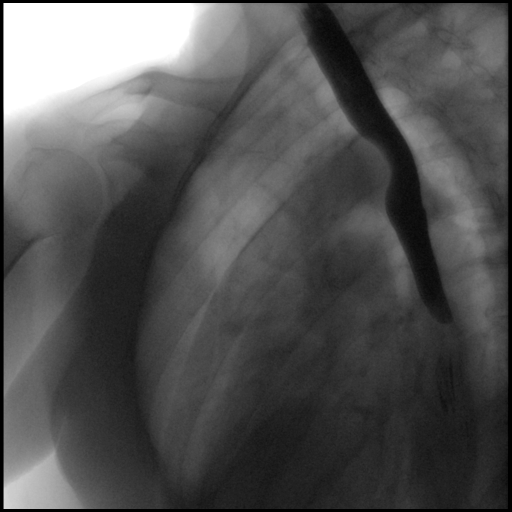
[frame 53/104]
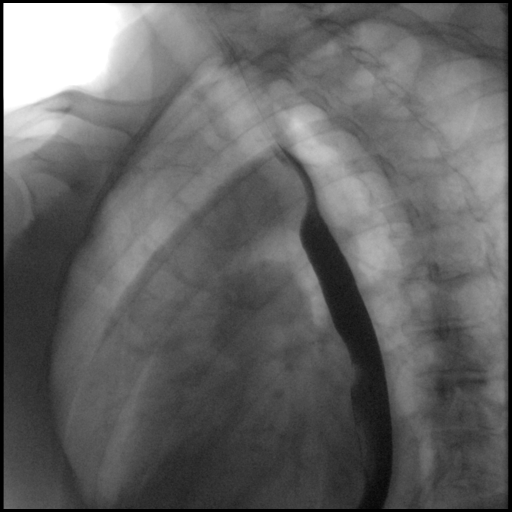

[Series 4: cp_standard · 0.36mm/px · 2 of 154 frames shown (4 of 9)]
[frame 26/154]
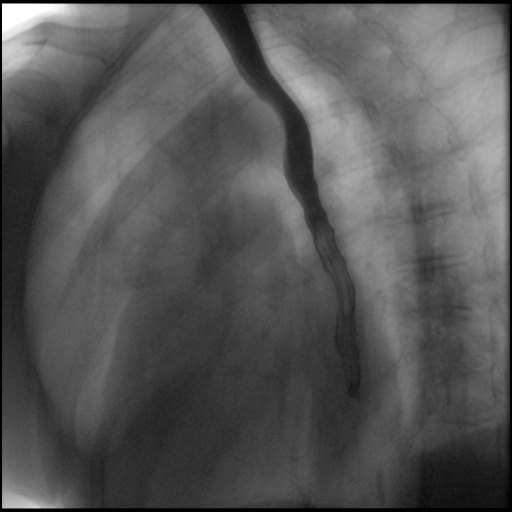
[frame 78/154]
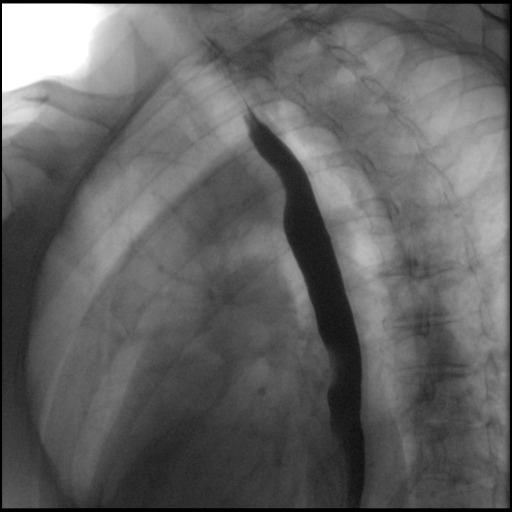

[Series 5: cp_standard · 0.36mm/px · 3 of 108 frames shown (5 of 9)]
[frame 55/108]
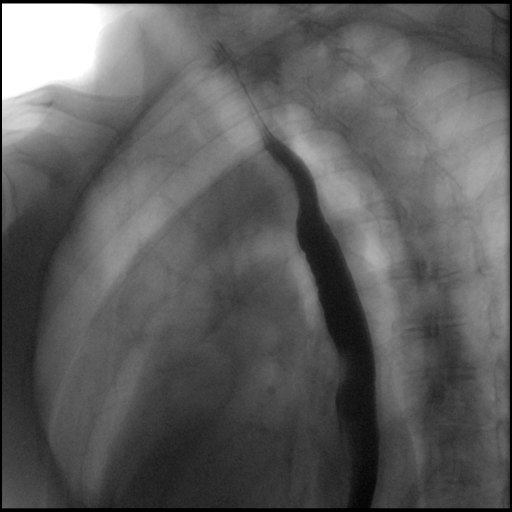
[frame 61/108]
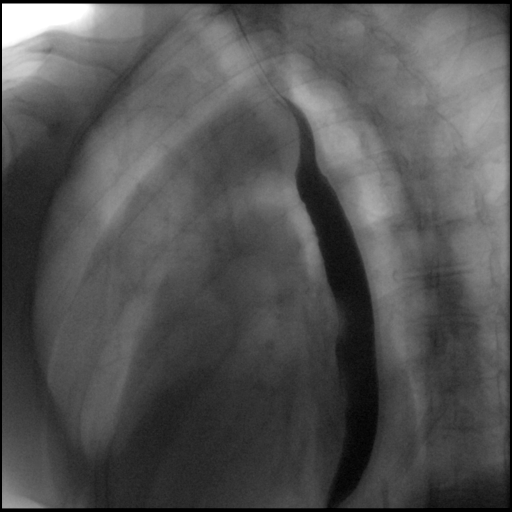
[frame 92/108]
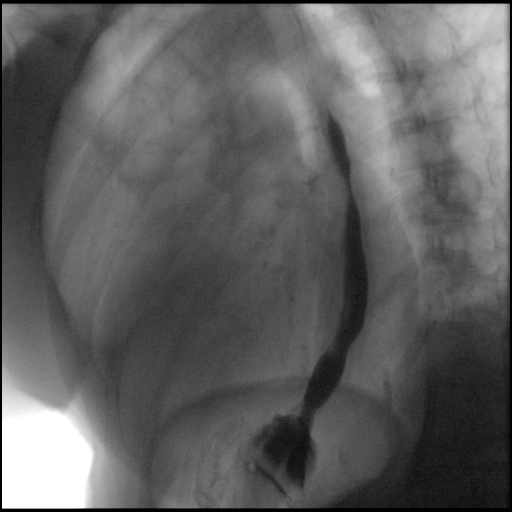

[Series 6: cp_standard · 0.36mm/px · 1 of 160 frames shown (6 of 9)]
[frame 81/160]
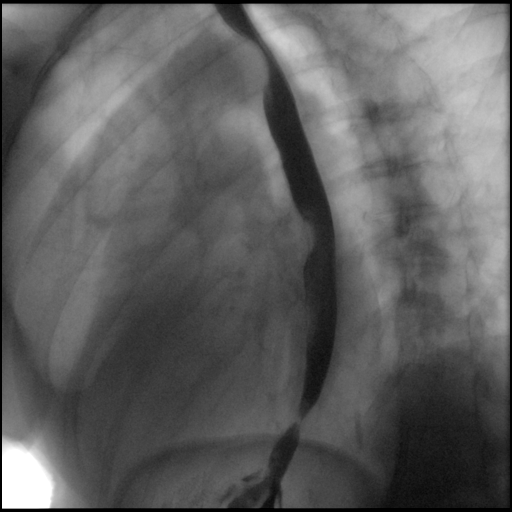

[Series 7: cp_standard · 0.36mm/px · 2 of 348 frames shown (7 of 9)]
[frame 2/348]
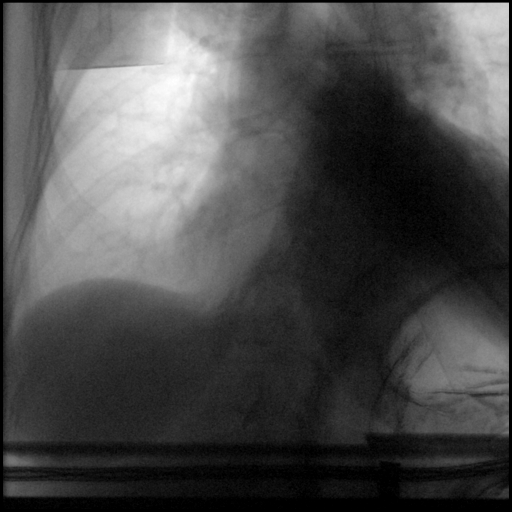
[frame 175/348]
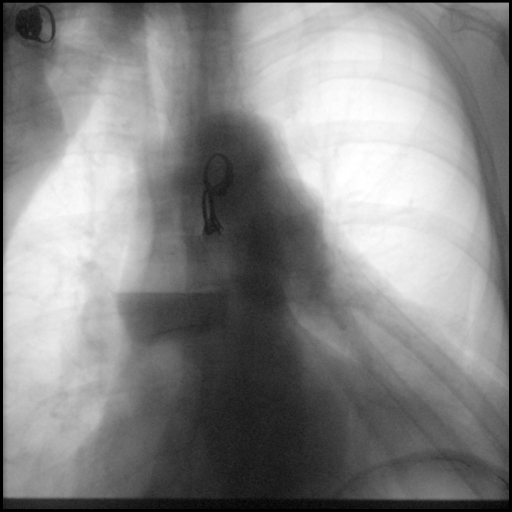

[Series 8: cp_standard · 0.18mm/px · 1 of 1 slices shown (8 of 9)]
[im 1/1]
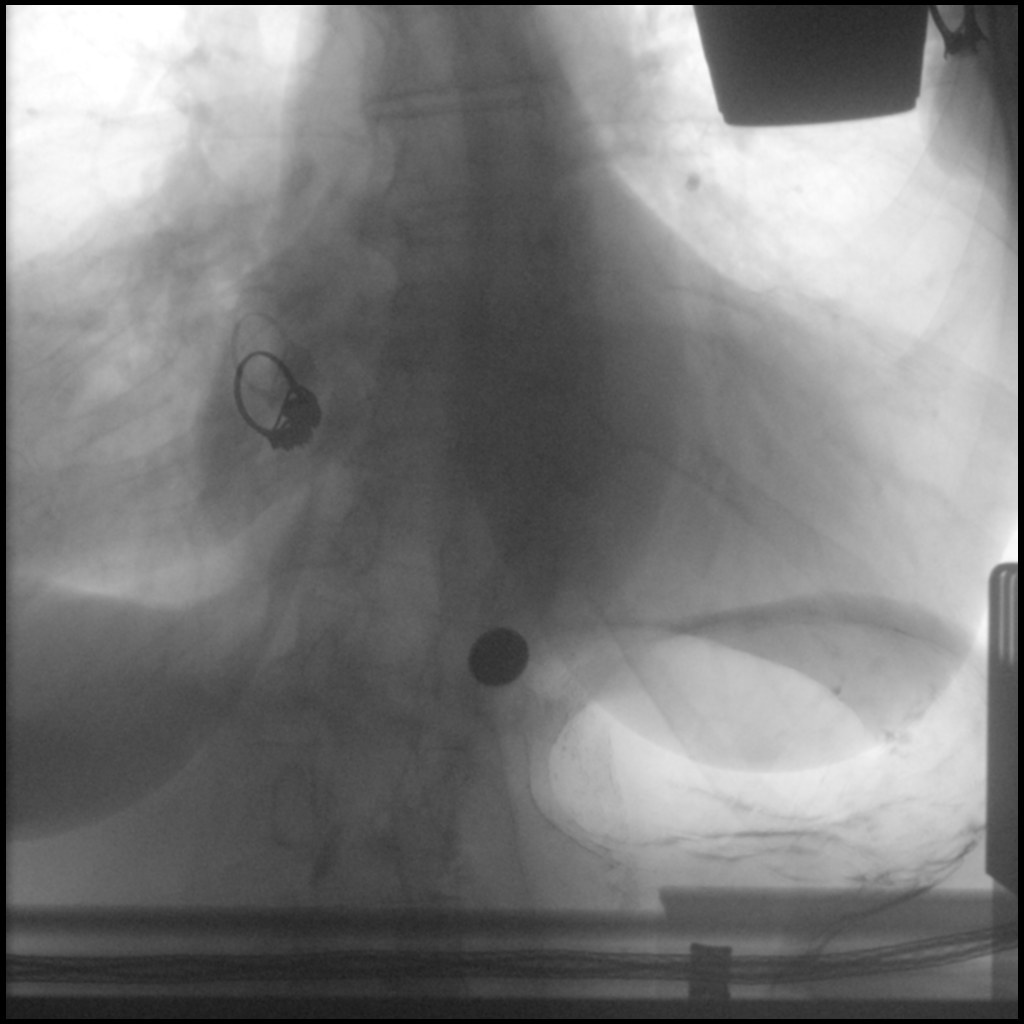

[Series 9: cp_standard · 0.36mm/px · 2 of 176 frames shown (9 of 9)]
[frame 1/176]
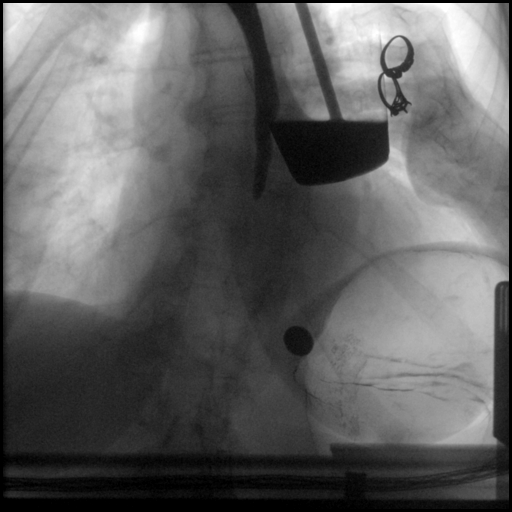
[frame 150/176]
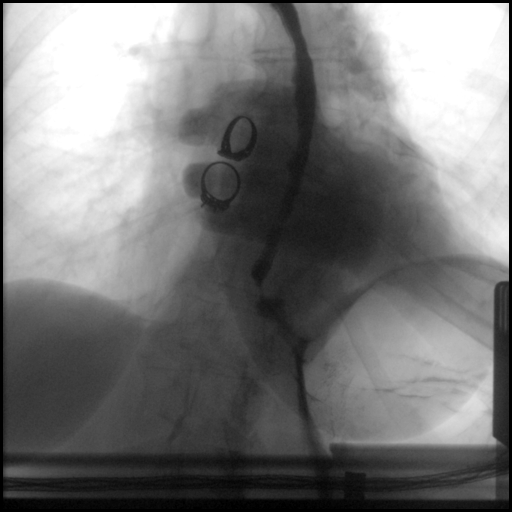

[17 of 24 positions shown; findings below may reference images not displayed]

FINDINGS: Fluoroscopic evaluation of swallowing demonstrates normal cervical
esophagus. Normal esophageal motility. There is mild short segment
smooth focal narrowing in the distal esophagus. The 13 mm barium
tablet sticks in this area and reproduces the patient's symptoms. No
reflux with the water siphon maneuver.
IMPRESSION: Mild smooth short-segment focal narrowing in the distal esophagus.
The barium tablet sticks in this area and reproduces the patient's
symptoms.

## 2019-04-28 ENCOUNTER — Other Ambulatory Visit: Payer: Self-pay | Admitting: Internal Medicine

## 2019-04-29 DIAGNOSIS — H401113 Primary open-angle glaucoma, right eye, severe stage: Secondary | ICD-10-CM | POA: Diagnosis not present

## 2019-05-05 ENCOUNTER — Other Ambulatory Visit: Payer: Self-pay | Admitting: Internal Medicine

## 2019-05-20 ENCOUNTER — Encounter: Payer: Medicare Other | Admitting: Internal Medicine

## 2019-05-26 ENCOUNTER — Other Ambulatory Visit: Payer: Self-pay | Admitting: Internal Medicine

## 2019-06-03 ENCOUNTER — Other Ambulatory Visit: Payer: Self-pay | Admitting: Internal Medicine

## 2019-06-04 NOTE — Telephone Encounter (Signed)
Pt has appt Monday, will wait for refill as pt should have enough to last through appt

## 2019-06-07 ENCOUNTER — Ambulatory Visit (INDEPENDENT_AMBULATORY_CARE_PROVIDER_SITE_OTHER): Payer: Medicare Other | Admitting: Internal Medicine

## 2019-06-07 ENCOUNTER — Other Ambulatory Visit: Payer: Self-pay

## 2019-06-07 ENCOUNTER — Encounter: Payer: Self-pay | Admitting: Internal Medicine

## 2019-06-07 VITALS — BP 124/72 | HR 62 | Temp 97.6°F | Ht 59.5 in | Wt 130.0 lb

## 2019-06-07 DIAGNOSIS — I10 Essential (primary) hypertension: Secondary | ICD-10-CM

## 2019-06-07 DIAGNOSIS — M159 Polyosteoarthritis, unspecified: Secondary | ICD-10-CM

## 2019-06-07 DIAGNOSIS — E039 Hypothyroidism, unspecified: Secondary | ICD-10-CM

## 2019-06-07 DIAGNOSIS — Z780 Asymptomatic menopausal state: Secondary | ICD-10-CM

## 2019-06-07 DIAGNOSIS — E559 Vitamin D deficiency, unspecified: Secondary | ICD-10-CM

## 2019-06-07 DIAGNOSIS — K580 Irritable bowel syndrome with diarrhea: Secondary | ICD-10-CM | POA: Diagnosis not present

## 2019-06-07 DIAGNOSIS — K589 Irritable bowel syndrome without diarrhea: Secondary | ICD-10-CM | POA: Insufficient documentation

## 2019-06-07 DIAGNOSIS — M8949 Other hypertrophic osteoarthropathy, multiple sites: Secondary | ICD-10-CM | POA: Diagnosis not present

## 2019-06-07 DIAGNOSIS — Z0000 Encounter for general adult medical examination without abnormal findings: Secondary | ICD-10-CM

## 2019-06-07 LAB — CBC
HCT: 46.8 % — ABNORMAL HIGH (ref 36.0–46.0)
Hemoglobin: 15.6 g/dL — ABNORMAL HIGH (ref 12.0–15.0)
MCHC: 33.3 g/dL (ref 30.0–36.0)
MCV: 102 fl — ABNORMAL HIGH (ref 78.0–100.0)
Platelets: 208 10*3/uL (ref 150.0–400.0)
RBC: 4.59 Mil/uL (ref 3.87–5.11)
RDW: 12.8 % (ref 11.5–15.5)
WBC: 5.6 10*3/uL (ref 4.0–10.5)

## 2019-06-07 LAB — TSH: TSH: 1.16 u[IU]/mL (ref 0.35–4.50)

## 2019-06-07 LAB — VITAMIN D 25 HYDROXY (VIT D DEFICIENCY, FRACTURES): VITD: 51.12 ng/mL (ref 30.00–100.00)

## 2019-06-07 LAB — COMPREHENSIVE METABOLIC PANEL
ALT: 13 U/L (ref 0–35)
AST: 23 U/L (ref 0–37)
Albumin: 4.7 g/dL (ref 3.5–5.2)
Alkaline Phosphatase: 72 U/L (ref 39–117)
BUN: 16 mg/dL (ref 6–23)
CO2: 31 mEq/L (ref 19–32)
Calcium: 9.9 mg/dL (ref 8.4–10.5)
Chloride: 96 mEq/L (ref 96–112)
Creatinine, Ser: 0.75 mg/dL (ref 0.40–1.20)
GFR: 74.18 mL/min (ref >60.00)
Glucose, Bld: 92 mg/dL (ref 70–99)
Potassium: 4.3 mEq/L (ref 3.5–5.1)
Sodium: 136 mEq/L (ref 135–145)
Total Bilirubin: 1.2 mg/dL (ref 0.2–1.2)
Total Protein: 7.5 g/dL (ref 6.0–8.3)

## 2019-06-07 LAB — LIPID PANEL
Cholesterol: 259 mg/dL — ABNORMAL HIGH (ref 0–200)
HDL: 115.3 mg/dL (ref >39.00)
LDL Cholesterol: 130 mg/dL — ABNORMAL HIGH (ref 0–99)
NonHDL: 144.02
Total CHOL/HDL Ratio: 2
Triglycerides: 72 mg/dL (ref 0.0–149.0)
VLDL: 14.4 mg/dL (ref 0.0–40.0)

## 2019-06-07 LAB — T4, FREE: Free T4: 1.17 ng/dL (ref 0.60–1.60)

## 2019-06-07 NOTE — Assessment & Plan Note (Signed)
Continue Glucosamine Chondroitin and Tylenol Arthritis

## 2019-06-07 NOTE — Assessment & Plan Note (Signed)
TSH and Free T4 today Continue Levothyroxine for now 

## 2019-06-07 NOTE — Patient Instructions (Signed)

## 2019-06-07 NOTE — Assessment & Plan Note (Signed)
Diet controlled Will monitor 

## 2019-06-07 NOTE — Assessment & Plan Note (Signed)
Controlled on Amlodipine and Spironolactone Reinforced DASH diet  CMET today

## 2019-06-07 NOTE — Progress Notes (Signed)
HPI:  Pt presents to the clinic today for her annual subsequent Medicare Wellness Exam. She is also due to follow up chronic conditions.  OA: Mainly in her shoulders. She takes Glucosamine Chondroitin and Tylenol Arthritis OTC.  HTN: Her BP today is 124/72. She is taking Amlodipine and Spironolactone as prescribed. ECG from 08/2017 reviewed.  IBS: Mainly diarrhea. Controlled with diet.  Hypothyroidism: She denies any issues on her current dose of Levothyroxine. She does not follow with endocrinology.   Past Medical History:  Diagnosis Date  . Allergy   . Arthritis   . Cataract   . Glaucoma   . Hypertension   . IBS (irritable bowel syndrome)   . Thyroid disease   . UTI (urinary tract infection)     Current Outpatient Medications  Medication Sig Dispense Refill  . Acetaminophen (TYLENOL ARTHRITIS PAIN PO) Take by mouth daily.    Marland Kitchen amLODipine (NORVASC) 10 MG tablet TAKE 1 TABLET (10 MG TOTAL) BY MOUTH DAILY. MUST SCHEDULE PHYSICAL 30 tablet 1  . Ascorbic Acid (VITAMIN C) 100 MG tablet Take 100 mg by mouth daily.    Marland Kitchen aspirin EC 81 MG tablet Take 81 mg by mouth daily.    . brimonidine (ALPHAGAN) 0.2 % ophthalmic solution Place 1 drop into the right eye 2 (two) times daily.    . Cholecalciferol (VITAMIN D) 2000 units CAPS Take 1 capsule by mouth daily.    . Coconut Oil 1000 MG CAPS Take by mouth daily.    . Collagen 500 MG CAPS Take by mouth daily.    . Garlic 10 MG CAPS Take by mouth daily.    Marland Kitchen glucosamine-chondroitin 500-400 MG tablet Take 1 tablet by mouth daily.    Marland Kitchen latanoprost (XALATAN) 0.005 % ophthalmic solution Place 1 drop into both eyes at bedtime.    Marland Kitchen levothyroxine (SYNTHROID) 125 MCG tablet TAKE 0.5 TABLETS (62.5 MCG TOTAL) BY MOUTH DAILY BEFORE BREAKFAST. 45 tablet 0  . loratadine (KLS ALLERCLEAR) 10 MG tablet Take 10 mg by mouth daily.    . Magnesium 400 MG CAPS Take by mouth daily.    . Omega-3 Fatty Acids (FISH OIL) 1200 MG CAPS Take by mouth daily.    . Prenatal  Vit-Fe Fumarate-FA (PRENATAL MULTIVITAMIN) TABS tablet Take 1 tablet by mouth daily at 12 noon.    Marland Kitchen spironolactone (ALDACTONE) 25 MG tablet TAKE 2 TABLETS BY MOUTH EVERY DAY 60 tablet 0  . timolol (BETIMOL) 0.25 % ophthalmic solution Place 1-2 drops into both eyes 2 (two) times daily.    Marland Kitchen triamcinolone cream (KENALOG) 0.1 % Apply 1 application topically 2 (two) times daily. 30 g 0  . TURMERIC PO Take 720 mg by mouth daily.    . vitamin E 100 UNIT capsule Take by mouth daily.     No current facility-administered medications for this visit.    Allergies  Allergen Reactions  . Acetazolamide Other (See Comments)    Blurred vision, Headache, fatigue, loss of appetite  . Benadryl [Diphenhydramine Hcl]     hyperactive  . Codeine   . Neurontin [Gabapentin]     unknown  . Penicillins   . Sulfa Antibiotics   . Tetracyclines & Related     Family History  Problem Relation Age of Onset  . Arthritis Mother   . Breast cancer Mother        in 61's  . Stroke Mother   . Hypertension Mother   . Hypertension Father   . Diabetes Daughter   .  Diabetes Son   . Arthritis Maternal Grandmother   . Arthritis Maternal Grandfather   . Colon cancer Neg Hx   . Esophageal cancer Neg Hx   . Inflammatory bowel disease Neg Hx   . Liver disease Neg Hx   . Pancreatic cancer Neg Hx   . Rectal cancer Neg Hx   . Stomach cancer Neg Hx     Social History   Socioeconomic History  . Marital status: Divorced    Spouse name: Not on file  . Number of children: 3  . Years of education: Not on file  . Highest education level: Not on file  Occupational History  . Occupation: retired  Tobacco Use  . Smoking status: Former Games developer  . Smokeless tobacco: Never Used  . Tobacco comment: quit 25 years ago  Substance and Sexual Activity  . Alcohol use: Yes    Alcohol/week: 7.0 standard drinks    Types: 7 Glasses of wine per week    Comment: with dinner  . Drug use: No  . Sexual activity: Not Currently   Other Topics Concern  . Not on file  Social History Narrative  . Not on file   Social Determinants of Health   Financial Resource Strain:   . Difficulty of Paying Living Expenses: Not on file  Food Insecurity:   . Worried About Programme researcher, broadcasting/film/video in the Last Year: Not on file  . Ran Out of Food in the Last Year: Not on file  Transportation Needs:   . Lack of Transportation (Medical): Not on file  . Lack of Transportation (Non-Medical): Not on file  Physical Activity:   . Days of Exercise per Week: Not on file  . Minutes of Exercise per Session: Not on file  Stress:   . Feeling of Stress : Not on file  Social Connections:   . Frequency of Communication with Friends and Family: Not on file  . Frequency of Social Gatherings with Friends and Family: Not on file  . Attends Religious Services: Not on file  . Active Member of Clubs or Organizations: Not on file  . Attends Banker Meetings: Not on file  . Marital Status: Not on file  Intimate Partner Violence:   . Fear of Current or Ex-Partner: Not on file  . Emotionally Abused: Not on file  . Physically Abused: Not on file  . Sexually Abused: Not on file    Hospitiliaztions: None  Health Maintenance:    Flu: 02/2019  Tetanus: < 5 years ago  Pneumovax: unsure  Prevnar: unsure  Zostavax: unsure  Shingrix: unsure  Mammogram: 05/2018  Pap Smear: no longer screening  Bone Density:  Colon Screening: < 10 years ago  Eye Doctor: as needed  Dental Exam: biannually   Providers:   PCP: Nicki Reaper, NP    I have personally reviewed and have noted:  1. The patient's medical and social history 2. Their use of alcohol, tobacco or illicit drugs 3. Their current medications and supplements 4. The patient's functional ability including ADL's, fall risks, home safety risks and hearing or visual impairment. 5. Diet and physical activities 6. Evidence for depression or mood disorder  Subjective:   Review of  Systems:   Constitutional: Denies fever, malaise, fatigue, headache or abrupt weight changes.  HEENT: Pt reports chronic pain of left ear. Denies eye pain, eye redness, ear pain, ringing in the ears, wax buildup, runny nose, nasal congestion, bloody nose, or sore throat. Respiratory: Denies difficulty breathing,  shortness of breath, cough or sputum production.   Cardiovascular: Denies chest pain, chest tightness, palpitations or swelling in the hands or feet.  Gastrointestinal: Pt reports intermittent diarrhea. Denies abdominal pain, bloating, constipation, or blood in the stool.  GU: Denies urgency, frequency, pain with urination, burning sensation, blood in urine, odor or discharge. Musculoskeletal: Pt reports intermittent joint pain. Denies decrease in range of motion, difficulty with gait, muscle pain or joint swelling.  Skin: Denies redness, rashes, lesions or ulcercations.  Neurological: Denies dizziness, difficulty with memory, difficulty with speech or problems with balance and coordination.  Psych: Denies anxiety, depression, SI/HI.  No other specific complaints in a complete review of systems (except as listed in HPI above).  Objective:  PE:   BP 124/72   Pulse 62   Temp 97.6 F (36.4 C) (Temporal)   Ht 4' 11.5" (1.511 m)   Wt 130 lb (59 kg)   SpO2 97%   BMI 25.82 kg/m   Wt Readings from Last 3 Encounters:  02/11/19 130 lb (59 kg)  06/29/18 131 lb (59.4 kg)  03/19/18 126 lb (57.2 kg)    General: Appears her stated age, well developed, well nourished in NAD. Skin: Warm, dry and intact. No rashes noted. HEENT: Head: normal shape and size; Eyes: sclera white, no icterus, conjunctiva pink, PERRLA and EOMs intact;  Neck: Neck supple, trachea midline. No masses, lumps or thyromegaly present.  Cardiovascular: Normal rate and rhythm. S1,S2 noted.  No murmur, rubs or gallops noted. No JVD or BLE edema. No carotid bruits noted. Pulmonary/Chest: Normal effort and positive  vesicular breath sounds. No respiratory distress. No wheezes, rales or ronchi noted.  Abdomen: Soft and nontender. Normal bowel sounds. No distention or masses noted. Liver, spleen and kidneys non palpable. Musculoskeletal: Strength 5/5 BUE/BLE. No difficulty with gait.  Neurological: Alert and oriented. Cranial nerves II-XII grossly intact. Coordination normal.  Psychiatric: Mood and affect normal. Behavior is normal. Judgment and thought content normal.     BMET    Component Value Date/Time   NA 134 (L) 02/11/2019 1540   K 4.1 02/11/2019 1540   CL 93 (L) 02/11/2019 1540   CO2 27 02/11/2019 1540   GLUCOSE 89 02/11/2019 1540   BUN 13 02/11/2019 1540   CREATININE 0.79 02/11/2019 1540   CALCIUM 10.4 02/11/2019 1540    Lipid Panel     Component Value Date/Time   CHOL 239 (H) 08/06/2017 1504   TRIG 77.0 08/06/2017 1504   HDL 120.90 08/06/2017 1504   CHOLHDL 2 08/06/2017 1504   VLDL 15.4 08/06/2017 1504   LDLCALC 103 (H) 08/06/2017 1504    CBC    Component Value Date/Time   WBC 6.0 02/05/2018 1546   RBC 4.55 02/05/2018 1546   HGB 15.2 (H) 02/05/2018 1546   HCT 45.1 02/05/2018 1546   PLT 222.0 02/05/2018 1546   MCV 99.1 02/05/2018 1546   MCHC 33.8 02/05/2018 1546   RDW 13.0 02/05/2018 1546    Hgb A1C No results found for: HGBA1C    Assessment and Plan:   Medicare Annual Wellness Visit:  Diet: She does eat meat. She consumes fruits and veggies daily. She rarely eats fried foods. She drinks mostly coffee and water. Physical activity: Sedentary Depression/mood screen: Negative, PHQ 9 score of 0 Hearing: Intact to whispered voice Visual acuity: Grossly normal, performs annual eye exam  ADLs: Capable Fall risk: None Home safety: Good Cognitive evaluation: Intact to orientation, naming, recall and repetition EOL planning: Adv directives, full code/  I agree  Preventative Medicine: Flu shot UTD. Will request records to see if she has had pneumovax, prevnar, zostovax  or shingrix. She no longer wants to perform mammogram, pap smear, or colon cancer screening. Bone density ordered. Encouraged her to consume a balanced diet and exercise regimen. Advised her to see an eye doctor and dentist annually. Will check CBC, CMET, Lipid, TSH, Free T4 and Vit D today. Due dates for screening exam given to pt as part of her AVS.   Next appointment: 1 year, Medicare Wellness Exam   Webb Silversmith, NP

## 2019-06-09 MED ORDER — AMLODIPINE BESYLATE 10 MG PO TABS: 10.0000 mg | ORAL_TABLET | Freq: Every day | ORAL | 2 refills | Status: DC

## 2019-06-09 MED ORDER — SPIRONOLACTONE 25 MG PO TABS: 50.0000 mg | ORAL_TABLET | Freq: Every day | ORAL | 2 refills | Status: DC

## 2019-06-09 NOTE — Telephone Encounter (Signed)
Rx sent through [facility]  

## 2019-06-09 NOTE — Telephone Encounter (Signed)
Patient called back in regards to the Levothyroxine refill She stated the pharmacy has not received the refill yet and she is almost out of medication

## 2019-06-24 ENCOUNTER — Other Ambulatory Visit: Payer: Self-pay

## 2019-06-24 ENCOUNTER — Encounter: Payer: Self-pay | Admitting: Emergency Medicine

## 2019-06-24 ENCOUNTER — Ambulatory Visit
Admission: EM | Admit: 2019-06-24 | Discharge: 2019-06-24 | Disposition: A | Discharge status: Home / Self Care | Payer: Medicare Other | Attending: Emergency Medicine | Admitting: Emergency Medicine

## 2019-06-24 DIAGNOSIS — N3001 Acute cystitis with hematuria: Secondary | ICD-10-CM | POA: Insufficient documentation

## 2019-06-24 DIAGNOSIS — B9689 Other specified bacterial agents as the cause of diseases classified elsewhere: Secondary | ICD-10-CM

## 2019-06-24 LAB — POCT URINALYSIS DIP (MANUAL ENTRY)
Bilirubin, UA: NEGATIVE
Glucose, UA: NEGATIVE mg/dL
Ketones, POC UA: NEGATIVE mg/dL
Nitrite, UA: NEGATIVE
Protein Ur, POC: NEGATIVE mg/dL
Spec Grav, UA: 1.01 (ref 1.010–1.025)
Urobilinogen, UA: 0.2 E.U./dL
pH, UA: 6.5 (ref 5.0–8.0)

## 2019-06-24 MED ORDER — FLUCONAZOLE 200 MG PO TABS: 200.0000 mg | ORAL_TABLET | Freq: Once | ORAL | 0 refills | Status: AC

## 2019-06-24 MED ORDER — CEPHALEXIN 500 MG PO CAPS: 500.0000 mg | ORAL_CAPSULE | Freq: Two times a day (BID) | ORAL | 0 refills | Status: AC

## 2019-06-24 NOTE — Discharge Instructions (Signed)
Thick antibiotic as prescribed. Urine culture pending-we will call you if we need to change your antibiotic. Return for worsening symptoms, blood in urine, belly or back pain, fever.

## 2019-06-24 NOTE — ED Provider Notes (Signed)
[facility]    CSN: [id number] Arrival date & time: 06/24/19  1331      History   Chief Complaint Chief Complaint  Patient presents with  . Back Pain    HPI Zyan Mirkin is a 79 y.o. female with history of hypertension presenting for 2-week course of dark urine with odor.  States that for the last week she started to have bilateral back pain (L>R).  Patient states she should get UTIs when she was younger, though used to have urgency, frequency.  Patient states over the last few years when she got some she will just noticed color noted.  Denies fever, abdominal pain, vaginal discharge.  Patient has never required hospitalization due to UTI.  Denies history of pyelonephritis, renal calculi.  Has been drinking more water to help with symptoms.   Past Medical History:  Diagnosis Date  . Allergy   . Arthritis   . Cataract   . Glaucoma   . Hypertension   . IBS (irritable bowel syndrome)   . Thyroid disease   . UTI (urinary tract infection)     Patient Active Problem List   Diagnosis Date Noted  . IBS (irritable bowel syndrome) 06/07/2019  . Osteoarthritis 01/01/2017  . HTN (hypertension) 01/01/2017  . Hypothyroidism 01/01/2017    Past Surgical History:  Procedure Laterality Date  . ABDOMINAL HYSTERECTOMY  1981   total  . APPENDECTOMY  1981  . cataracts Bilateral   . HERNIA REPAIR  1980  . TONSILLECTOMY AND ADENOIDECTOMY      OB History   No obstetric history on file.      Home Medications    Prior to Admission medications   Medication Sig Start Date End Date Taking? Authorizing Provider  Acetaminophen (TYLENOL ARTHRITIS PAIN PO) Take by mouth daily.    [provider]  amLODipine (NORVASC) 10 MG tablet Take 1 tablet (10 mg total) by mouth daily. 06/09/19   Lorre Munroe, NP  Ascorbic Acid (VITAMIN C) 100 MG tablet Take 100 mg by mouth daily.    [provider]  aspirin EC 81 MG tablet Take 81 mg by mouth daily.    [provider]  brimonidine (ALPHAGAN) 0.2 % ophthalmic solution Place 1 drop into the right eye 2 (two) times daily.    [provider]  cephALEXin (KEFLEX) 500 MG capsule Take 1 capsule (500 mg total) by mouth 2 (two) times daily for 5 days. 06/24/19 06/29/19  Hall-Potvin, [place], PA-C  Cholecalciferol (VITAMIN D) 2000 units CAPS Take 1 capsule by mouth daily.    [provider]  Coconut Oil 1000 MG CAPS Take by mouth daily.    [provider]  Collagen 500 MG CAPS Take by mouth daily.    [provider]  fluconazole (DIFLUCAN) 200 MG tablet Take 1 tablet (200 mg total) by mouth once for 1 dose. May repeat in 72 hours if needed 06/24/19 06/24/19  Hall-Potvin, [place], PA-C  Garlic 10 MG CAPS Take by mouth daily.    [provider]  glucosamine-chondroitin 500-400 MG tablet Take 1 tablet by mouth daily.    [provider]  latanoprost (XALATAN) 0.005 % ophthalmic solution Place 1 drop into both eyes at bedtime.    [provider]  levothyroxine (SYNTHROID) 125 MCG tablet TAKE 0.5 TABLETS (62.5 MCG TOTAL) BY MOUTH DAILY BEFORE BREAKFAST. 06/09/19   Lorre Munroe, NP  loratadine (KLS ALLERCLEAR) 10 MG tablet Take 10 mg by mouth daily.  [provider]  Magnesium 400 MG CAPS Take by mouth daily.    [provider]  Omega-3 Fatty Acids (FISH OIL) 1200 MG CAPS Take by mouth daily.    [provider]  Prenatal Vit-Fe Fumarate-FA (PRENATAL MULTIVITAMIN) TABS tablet Take 1 tablet by mouth daily at 12 noon.    [provider]  spironolactone (ALDACTONE) 25 MG tablet Take 2 tablets (50 mg total) by mouth daily. 06/09/19   Jearld Fenton, NP  timolol (BETIMOL) 0.25 % ophthalmic solution Place 1-2 drops into both eyes 2 (two) times daily.    [provider]  triamcinolone cream (KENALOG) 0.1 % Apply 1 application topically 2 (two) times daily. 08/27/18   Scot Jun, FNP  TURMERIC PO Take 720 mg by  mouth daily.    [provider]  vitamin E 100 UNIT capsule Take by mouth daily.    [provider]    Family History Family History  Problem Relation Age of Onset  . Arthritis Mother   . Breast cancer Mother        in 49's  . Stroke Mother   . Hypertension Mother   . Hypertension Father   . Diabetes Daughter   . Diabetes Son   . Arthritis Maternal Grandmother   . Arthritis Maternal Grandfather   . Colon cancer Neg Hx   . Esophageal cancer Neg Hx   . Inflammatory bowel disease Neg Hx   . Liver disease Neg Hx   . Pancreatic cancer Neg Hx   . Rectal cancer Neg Hx   . Stomach cancer Neg Hx     Social History Social History   Tobacco Use  . Smoking status: Former Research scientist (life sciences)  . Smokeless tobacco: Never Used  . Tobacco comment: quit 25 years ago  Substance Use Topics  . Alcohol use: Yes    Alcohol/week: 7.0 standard drinks    Types: 7 Glasses of wine per week    Comment: with dinner  . Drug use: No     Allergies   Acetazolamide, Benadryl [diphenhydramine hcl], Codeine, Neurontin [gabapentin], Penicillins, Sulfa antibiotics, Tetracyclines & related, and Decongestant [oxymetazoline]   Review of Systems As per HPI   Physical Exam Triage Vital Signs ED Triage Vitals  Enc Vitals Group     BP      Pulse      Resp      Temp      Temp src      SpO2      Weight      Height      Head Circumference      Peak Flow      Pain Score      Pain Loc      Pain Edu?      Excl. in [place]?    No data found.  Updated Vital Signs BP (!) 168/94   Pulse 68   Temp 98 F (36.7 C) (Oral)   Resp 16   Visual Acuity Right Eye Distance:   Left Eye Distance:   Bilateral Distance:    Right Eye Near:   Left Eye Near:    Bilateral Near:     Physical Exam Constitutional:      General: She is not in acute distress.    Appearance: She is not ill-appearing.  HENT:     Head: Normocephalic and atraumatic.     Mouth/Throat:     Mouth: Mucous membranes are moist.      Pharynx: Oropharynx  is clear.  Eyes:     General: No scleral icterus.    Pupils: Pupils are equal, round, and reactive to light.  Cardiovascular:     Rate and Rhythm: Normal rate and regular rhythm.  Pulmonary:     Effort: Pulmonary effort is normal. No respiratory distress.     Breath sounds: No wheezing.  Abdominal:     General: Bowel sounds are normal.     Palpations: Abdomen is soft.     Tenderness: There is no abdominal tenderness. There is no right CVA tenderness, left CVA tenderness or guarding.  Skin:    Coloration: Skin is not jaundiced or pale.  Neurological:     Mental Status: She is alert and oriented to person, place, and time.      UC Treatments / Results  Labs (all labs ordered are listed, but only abnormal results are displayed) Labs Reviewed  POCT URINALYSIS DIP (MANUAL ENTRY) - Abnormal; Notable for the following components:      Result Value   Blood, UA trace-intact (*)    Leukocytes, UA Small (1+) (*)    All other components within normal limits  URINE CULTURE    EKG   Radiology No results found.  Procedures Procedures (including critical care time)  Medications Ordered in UC Medications - No data to display  Initial Impression / Assessment and Plan / UC Course  I have reviewed the triage vital signs and the nursing notes.  Pertinent labs & imaging results that were available during my care of the patient were reviewed by me and considered in my medical decision making (see chart for details).     Patient afebrile, nontoxic in office today.  Urine dipstick showing trace intact blood, small leukocytes-culture pending.  Will start Keflex today for UTI.  Low concern for pyelonephritis given lack of fever, CVA tenderness, additional abnormalities on urine dipstick.  Patient has tolerated Keflex well despite reported penicillin allergy.  States that sometimes she will get yeast infections after antibiotics: Diflucan sent.  Return precautions  discussed, patient verbalized understanding and is agreeable to plan. Final Clinical Impressions(s) / UC Diagnoses   Final diagnoses:  Acute cystitis with hematuria     Discharge Instructions     Thick antibiotic as prescribed. Urine culture pending-we will call you if we need to change your antibiotic. Return for worsening symptoms, blood in urine, belly or back pain, fever.    ED Prescriptions    Medication Sig Dispense Auth. Provider   cephALEXin (KEFLEX) 500 MG capsule Take 1 capsule (500 mg total) by mouth 2 (two) times daily for 5 days. 10 capsule Hall-Potvin, [place], PA-C   fluconazole (DIFLUCAN) 200 MG tablet Take 1 tablet (200 mg total) by mouth once for 1 dose. May repeat in 72 hours if needed 2 tablet Hall-Potvin, [place], PA-C     PDMP not reviewed this encounter.   Hall-Potvin, [place], [place] 06/24/19 1409

## 2019-06-24 NOTE — ED Notes (Signed)
Patient able to ambulate independently  

## 2019-06-24 NOTE — ED Triage Notes (Addendum)
Pt presents to [facility] for assessment of dark urine with an odor "for a while" and states her PCP has been encouraging her to drink more water.  Patient states 1 week ago she began having bilateral mid back pain with radiation to right hip and a warm sensation to her mid lower abdomen with pressure.

## 2019-06-25 DIAGNOSIS — H401113 Primary open-angle glaucoma, right eye, severe stage: Secondary | ICD-10-CM | POA: Diagnosis not present

## 2019-06-25 DIAGNOSIS — H401122 Primary open-angle glaucoma, left eye, moderate stage: Secondary | ICD-10-CM | POA: Diagnosis not present

## 2019-06-25 DIAGNOSIS — Z961 Presence of intraocular lens: Secondary | ICD-10-CM | POA: Diagnosis not present

## 2019-06-26 LAB — URINE CULTURE: Culture: <10000 — AB

## 2019-07-08 ENCOUNTER — Ambulatory Visit: Payer: Medicare Other

## 2019-07-12 ENCOUNTER — Ambulatory Visit: Payer: Medicare Other | Attending: Family

## 2019-07-12 DIAGNOSIS — Z23 Encounter for immunization: Secondary | ICD-10-CM | POA: Insufficient documentation

## 2019-07-12 NOTE — Progress Notes (Signed)
   Covid-19 Vaccination Clinic  Name:  Kara Green    MRN: [id number] DOB: 1940-06-08  07/12/2019  [name] was observed post Covid-19 immunization for 30 minutes based on pre-vaccination screening without incidence. She was provided with Vaccine Information Sheet and instruction to access the V-Safe system.   [name] was instructed to call 911 with any severe reactions post vaccine: Marland Kitchen Difficulty breathing  . Swelling of your face and throat  . A fast heartbeat  . A bad rash all over your body  . Dizziness and weakness    Immunizations Administered    Name Date Dose VIS Date Route   Moderna COVID-19 Vaccine 07/12/2019 11:00 AM 0.5 mL 04/20/2019 Intramuscular   Manufacturer: Moderna   Lot: [id number]   NDC: 06349-494-47

## 2019-08-02 DIAGNOSIS — H401113 Primary open-angle glaucoma, right eye, severe stage: Secondary | ICD-10-CM | POA: Diagnosis not present

## 2019-08-02 DIAGNOSIS — H401122 Primary open-angle glaucoma, left eye, moderate stage: Secondary | ICD-10-CM | POA: Diagnosis not present

## 2019-08-10 ENCOUNTER — Ambulatory Visit: Payer: Medicare Other | Attending: Family

## 2019-08-10 DIAGNOSIS — Z23 Encounter for immunization: Secondary | ICD-10-CM

## 2019-08-10 NOTE — Progress Notes (Signed)
   Covid-19 Vaccination Clinic  Name:  Kara Green    MRN: [id number] DOB: 07-21-1940  08/10/2019  Kara Green was observed post Covid-19 immunization for 15 minutes without incident. She was provided with Vaccine Information Sheet and instruction to access the V-Safe system.   Kara Green was instructed to call 911 with any severe reactions post vaccine: Marland Kitchen Difficulty breathing  . Swelling of face and throat  . A fast heartbeat  . A bad rash all over body  . Dizziness and weakness   Immunizations Administered    Name Date Dose VIS Date Route   Moderna COVID-19 Vaccine 08/10/2019  1:41 PM 0.5 mL 04/20/2019 Intramuscular   Manufacturer: Moderna   Lot: [id number]   NDC: 47096-283-66

## 2019-09-07 DIAGNOSIS — H401122 Primary open-angle glaucoma, left eye, moderate stage: Secondary | ICD-10-CM | POA: Diagnosis not present

## 2019-09-07 DIAGNOSIS — H401113 Primary open-angle glaucoma, right eye, severe stage: Secondary | ICD-10-CM | POA: Diagnosis not present

## 2019-09-13 ENCOUNTER — Other Ambulatory Visit: Payer: Self-pay

## 2019-09-13 ENCOUNTER — Ambulatory Visit (INDEPENDENT_AMBULATORY_CARE_PROVIDER_SITE_OTHER): Payer: Medicare Other | Admitting: Internal Medicine

## 2019-09-13 ENCOUNTER — Encounter: Payer: Self-pay | Admitting: Internal Medicine

## 2019-09-13 VITALS — BP 134/90 | HR 76 | Temp 97.0°F | Wt 136.0 lb

## 2019-09-13 DIAGNOSIS — E559 Vitamin D deficiency, unspecified: Secondary | ICD-10-CM

## 2019-09-13 DIAGNOSIS — R5383 Other fatigue: Secondary | ICD-10-CM

## 2019-09-13 DIAGNOSIS — R635 Abnormal weight gain: Secondary | ICD-10-CM

## 2019-09-13 DIAGNOSIS — E538 Deficiency of other specified B group vitamins: Secondary | ICD-10-CM | POA: Diagnosis not present

## 2019-09-13 DIAGNOSIS — L659 Nonscarring hair loss, unspecified: Secondary | ICD-10-CM

## 2019-09-13 LAB — T4, FREE: Free T4: 1.67 ng/dL — ABNORMAL HIGH (ref 0.60–1.60)

## 2019-09-13 LAB — VITAMIN D 25 HYDROXY (VIT D DEFICIENCY, FRACTURES): VITD: 60.32 ng/mL (ref 30.00–100.00)

## 2019-09-13 LAB — TSH: TSH: 1.21 u[IU]/mL (ref 0.35–4.50)

## 2019-09-13 LAB — VITAMIN B12: Vitamin B-12: 322 pg/mL (ref 211–911)

## 2019-09-13 NOTE — Progress Notes (Signed)
Subjective:    Patient ID: Kara Green, female    DOB: [birth year], 79 y.o.   MRN: [id number]  HPI  Pt presents to the clinic today with c/o concern for fatigue, hair thinning and weight gain. This has been going on for a few months. She reports she normally sleeps well but over the last few weeks, she has had trouble staying asleep. She has gained 6 lbs in the last 4 months. She denies change in her diet, maybe eating less but also less active. She has not actually noticed any bald spots in her hair.  She has a history of hypothyroidism, managed on Levothyroxine 125 mcg daily. She denies new stressors. She has not taken anything OTC for this.  Review of Systems      Past Medical History:  Diagnosis Date  . Allergy   . Arthritis   . Cataract   . Glaucoma   . Hypertension   . IBS (irritable bowel syndrome)   . Thyroid disease   . UTI (urinary tract infection)     Current Outpatient Medications  Medication Sig Dispense Refill  . Acetaminophen (TYLENOL ARTHRITIS PAIN PO) Take by mouth daily.    Marland Kitchen amLODipine (NORVASC) 10 MG tablet Take 1 tablet (10 mg total) by mouth daily. 90 tablet 2  . Ascorbic Acid (VITAMIN C) 100 MG tablet Take 100 mg by mouth daily.    Marland Kitchen aspirin EC 81 MG tablet Take 81 mg by mouth daily.    . brimonidine (ALPHAGAN) 0.2 % ophthalmic solution Place 1 drop into the right eye 2 (two) times daily.    . Cholecalciferol (VITAMIN D) 2000 units CAPS Take 1 capsule by mouth daily.    . Coconut Oil 1000 MG CAPS Take by mouth daily.    . Collagen 500 MG CAPS Take by mouth daily.    . Garlic 10 MG CAPS Take by mouth daily.    Marland Kitchen glucosamine-chondroitin 500-400 MG tablet Take 1 tablet by mouth daily.    Marland Kitchen latanoprost (XALATAN) 0.005 % ophthalmic solution Place 1 drop into both eyes at bedtime.    Marland Kitchen levothyroxine (SYNTHROID) 125 MCG tablet TAKE 0.5 TABLETS (62.5 MCG TOTAL) BY MOUTH DAILY BEFORE BREAKFAST. 45 tablet 2  . loratadine (KLS ALLERCLEAR) 10 MG tablet Take 10  mg by mouth daily.    . Magnesium 400 MG CAPS Take by mouth daily.    . Omega-3 Fatty Acids (FISH OIL) 1200 MG CAPS Take by mouth daily.    . Prenatal Vit-Fe Fumarate-FA (PRENATAL MULTIVITAMIN) TABS tablet Take 1 tablet by mouth daily at 12 noon.    Marland Kitchen spironolactone (ALDACTONE) 25 MG tablet Take 2 tablets (50 mg total) by mouth daily. 180 tablet 2  . timolol (BETIMOL) 0.25 % ophthalmic solution Place 1-2 drops into both eyes 2 (two) times daily.    Marland Kitchen triamcinolone cream (KENALOG) 0.1 % Apply 1 application topically 2 (two) times daily. 30 g 0  . TURMERIC PO Take 720 mg by mouth daily.    . vitamin E 100 UNIT capsule Take by mouth daily.     No current facility-administered medications for this visit.    Allergies  Allergen Reactions  . Acetazolamide Other (See Comments)    Blurred vision, Headache, fatigue, loss of appetite  . Benadryl [Diphenhydramine Hcl]     hyperactive  . Codeine   . Neurontin [Gabapentin]     unknown  . Penicillins   . Sulfa Antibiotics   . Tetracyclines & Related   .  Decongestant [Oxymetazoline]     Unsure on reaction, was told to never take again     Family History  Problem Relation Age of Onset  . Arthritis Mother   . Breast cancer Mother        in 53's  . Stroke Mother   . Hypertension Mother   . Hypertension Father   . Diabetes Daughter   . Diabetes Son   . Arthritis Maternal Grandmother   . Arthritis Maternal Grandfather   . Colon cancer Neg Hx   . Esophageal cancer Neg Hx   . Inflammatory bowel disease Neg Hx   . Liver disease Neg Hx   . Pancreatic cancer Neg Hx   . Rectal cancer Neg Hx   . Stomach cancer Neg Hx     Social History   Socioeconomic History  . Marital status: Divorced    Spouse name: Not on file  . Number of children: 3  . Years of education: Not on file  . Highest education level: Not on file  Occupational History  . Occupation: retired  Tobacco Use  . Smoking status: Former Research scientist (life sciences)  . Smokeless tobacco: Never  Used  . Tobacco comment: quit 25 years ago  Substance and Sexual Activity  . Alcohol use: Yes    Alcohol/week: 7.0 standard drinks    Types: 7 Glasses of wine per week    Comment: with dinner  . Drug use: No  . Sexual activity: Not Currently  Other Topics Concern  . Not on file  Social History Narrative  . Not on file   Social Determinants of Health   Financial Resource Strain:   . Difficulty of Paying Living Expenses:   Food Insecurity:   . Worried About Charity fundraiser in the Last Year:   . Arboriculturist in the Last Year:   Transportation Needs:   . Film/video editor (Medical):   Marland Kitchen Lack of Transportation (Non-Medical):   Physical Activity:   . Days of Exercise per Week:   . Minutes of Exercise per Session:   Stress:   . Feeling of Stress :   Social Connections:   . Frequency of Communication with Friends and Family:   . Frequency of Social Gatherings with Friends and Family:   . Attends Religious Services:   . Active Member of Clubs or Organizations:   . Attends Archivist Meetings:   Marland Kitchen Marital Status:   Intimate Partner Violence:   . Fear of Current or Ex-Partner:   . Emotionally Abused:   Marland Kitchen Physically Abused:   . Sexually Abused:      Constitutional: Pt reports faituge, weight loss. Denies fever, malaise, headache or abrupt weight changes.  HEENT: Pt reports hair thinning. Denies eye pain, eye redness, ear pain, ringing in the ears, wax buildup, runny nose, nasal congestion, bloody nose, or sore throat. Respiratory: Denies difficulty breathing, shortness of breath, cough or sputum production.   Cardiovascular: Denies chest pain, chest tightness, palpitations or swelling in the hands or feet.  Musculoskeletal: Denies decrease in range of motion, difficulty with gait, muscle pain or joint pain and swelling.  Skin: Denies redness, rashes, lesions or ulcercations.  Neurological: Denies dizziness, difficulty with memory, difficulty with speech or  problems with balance and coordination.  Psych: Denies anxiety, depression, SI/HI.  No other specific complaints in a complete review of systems (except as listed in HPI above).  Objective:   Physical Exam  BP 134/90   Pulse 76  Temp (!) 97 F (36.1 C) (Temporal)   Wt 136 lb (61.7 kg)   SpO2 98%   BMI 27.01 kg/m   Wt Readings from Last 3 Encounters:  06/07/19 130 lb (59 kg)  02/11/19 130 lb (59 kg)  06/29/18 131 lb (59.4 kg)    General: Appears her stated age, well developed, well nourished in NAD. HEENT: Head: normal shape and size; Eyes: sclera white, no icterus, conjunctiva pink, PERRLA and EOMs intact;  Neck:  Neck supple, trachea midline. No masses, lumps or thyromegaly present.  Cardiovascular: Normal rate and rhythm. S1,S2 noted.  No murmur, rubs or gallops noted.  Pulmonary/Chest: Normal effort and positive vesicular breath sounds. No respiratory distress. No wheezes, rales or ronchi noted.  AMusculoskeletal:  No difficulty with gait.  Neurological: Alert and oriented.    BMET    Component Value Date/Time   NA 136 06/07/2019 1255   K 4.3 06/07/2019 1255   CL 96 06/07/2019 1255   CO2 31 06/07/2019 1255   GLUCOSE 92 06/07/2019 1255   BUN 16 06/07/2019 1255   CREATININE 0.75 06/07/2019 1255   CALCIUM 9.9 06/07/2019 1255    Lipid Panel     Component Value Date/Time   CHOL 259 (H) 06/07/2019 1255   TRIG 72.0 06/07/2019 1255   HDL 115.30 06/07/2019 1255   CHOLHDL 2 06/07/2019 1255   VLDL 14.4 06/07/2019 1255   LDLCALC 130 (H) 06/07/2019 1255    CBC    Component Value Date/Time   WBC 5.6 06/07/2019 1255   RBC 4.59 06/07/2019 1255   HGB 15.6 (H) 06/07/2019 1255   HCT 46.8 (H) 06/07/2019 1255   PLT 208.0 06/07/2019 1255   MCV 102.0 (H) 06/07/2019 1255   MCHC 33.3 06/07/2019 1255   RDW 12.8 06/07/2019 1255    Hgb A1C No results found for: HGBA1C          Assessment & Plan:   Fatigue, Hair Thinning, Weight Gain:  Will check TSH and Free  T4 today Will adjust Levothyroxine if needed based on labs  B12 Deficiency, Vit D Deficiency:  Will check Vit D and B12 today to see if this is a contributing factor  Will follow up after labs, return precautions discussed  Nicki Reaper, NP This visit occurred during the SARS-CoV-2 public health emergency.  Safety protocols were in place, including screening questions prior to the visit, additional usage of staff PPE, and extensive cleaning of exam room while observing appropriate contact time as indicated for disinfecting solutions.

## 2019-09-13 NOTE — Patient Instructions (Signed)

## 2019-09-28 ENCOUNTER — Encounter: Payer: Self-pay | Admitting: Internal Medicine

## 2019-09-28 ENCOUNTER — Ambulatory Visit (INDEPENDENT_AMBULATORY_CARE_PROVIDER_SITE_OTHER): Payer: Medicare Other | Admitting: Internal Medicine

## 2019-09-28 ENCOUNTER — Other Ambulatory Visit: Payer: Self-pay

## 2019-09-28 VITALS — BP 148/80 | HR 72 | Temp 97.3°F | Ht 59.5 in | Wt 134.8 lb

## 2019-09-28 DIAGNOSIS — Z114 Encounter for screening for human immunodeficiency virus [HIV]: Secondary | ICD-10-CM | POA: Diagnosis not present

## 2019-09-28 DIAGNOSIS — Z21 Asymptomatic human immunodeficiency virus [HIV] infection status: Secondary | ICD-10-CM

## 2019-09-28 NOTE — Progress Notes (Signed)
Subjective:    Patient ID: Kara Green, female    DOB: 03/09/1941, 79 y.o.   MRN: [id number]  HPI  Pt presents to the clinic today to discuss her results she received from the [facility]. They sent her a letter on 4/16 stating that she had a positive HIV antibody but that her confirmation testing was negative. She donates blood all the time and has never had any issues. She has never been told she has had HIV in the past. She denies IV drug use, sexual promiscuity.  Review of Systems  Past Medical History:  Diagnosis Date  . Allergy   . Arthritis   . Cataract   . Glaucoma   . Hypertension   . IBS (irritable bowel syndrome)   . Thyroid disease   . UTI (urinary tract infection)     Current Outpatient Medications  Medication Sig Dispense Refill  . Acetaminophen (TYLENOL ARTHRITIS PAIN PO) Take by mouth daily.    Marland Kitchen amLODipine (NORVASC) 10 MG tablet Take 1 tablet (10 mg total) by mouth daily. 90 tablet 2  . APPLE CIDER VINEGAR PO Take by mouth.    . Ascorbic Acid (VITAMIN C) 100 MG tablet Take 100 mg by mouth daily.    Marland Kitchen aspirin EC 81 MG tablet Take 81 mg by mouth daily.    . Cholecalciferol (VITAMIN D) 2000 units CAPS Take 1 capsule by mouth daily.    Marland Kitchen CINNAMON PO Take by mouth.    . Coconut Oil 1000 MG CAPS Take by mouth daily.    . Collagen 500 MG CAPS Take by mouth daily.    . Garlic 10 MG CAPS Take by mouth daily.    Marland Kitchen glucosamine-chondroitin 500-400 MG tablet Take 1 tablet by mouth daily.    Marland Kitchen latanoprost (XALATAN) 0.005 % ophthalmic solution Place 1 drop into both eyes at bedtime.    Marland Kitchen levothyroxine (SYNTHROID) 125 MCG tablet TAKE 0.5 TABLETS (62.5 MCG TOTAL) BY MOUTH DAILY BEFORE BREAKFAST. 45 tablet 2  . loratadine (KLS ALLERCLEAR) 10 MG tablet Take 10 mg by mouth daily.    . Magnesium 400 MG CAPS Take by mouth daily.    . Omega-3 Fatty Acids (FISH OIL) 1200 MG CAPS Take by mouth daily.    . Prenatal Vit-Fe Fumarate-FA (PRENATAL MULTIVITAMIN) TABS tablet  Take 1 tablet by mouth daily at 12 noon.    Marland Kitchen spironolactone (ALDACTONE) 25 MG tablet Take 2 tablets (50 mg total) by mouth daily. 180 tablet 2  . timolol (BETIMOL) 0.25 % ophthalmic solution Place 1-2 drops into both eyes 2 (two) times daily.    Marland Kitchen triamcinolone cream (KENALOG) 0.1 % Apply 1 application topically 2 (two) times daily. 30 g 0  . TURMERIC PO Take 720 mg by mouth daily.    . vitamin E 100 UNIT capsule Take by mouth daily.     No current facility-administered medications for this visit.    Allergies  Allergen Reactions  . Acetazolamide Other (See Comments)    Blurred vision, Headache, fatigue, loss of appetite  . Benadryl [Diphenhydramine Hcl]     hyperactive  . Codeine   . Neurontin [Gabapentin]     unknown  . Penicillins   . Sulfa Antibiotics   . Tetracyclines & Related   . Decongestant [Oxymetazoline]     Unsure on reaction, was told to never take again     Family History  Problem Relation Age of Onset  . Arthritis Mother   . Breast cancer  Mother        in 44's  . Stroke Mother   . Hypertension Mother   . Hypertension Father   . Diabetes Daughter   . Diabetes Son   . Arthritis Maternal Grandmother   . Arthritis Maternal Grandfather   . Colon cancer Neg Hx   . Esophageal cancer Neg Hx   . Inflammatory bowel disease Neg Hx   . Liver disease Neg Hx   . Pancreatic cancer Neg Hx   . Rectal cancer Neg Hx   . Stomach cancer Neg Hx     Social History   Socioeconomic History  . Marital status: Divorced    Spouse name: Not on file  . Number of children: 3  . Years of education: Not on file  . Highest education level: Not on file  Occupational History  . Occupation: retired  Tobacco Use  . Smoking status: Former Research scientist (life sciences)  . Smokeless tobacco: Never Used  . Tobacco comment: quit 25 years ago  Substance and Sexual Activity  . Alcohol use: Yes    Alcohol/week: 7.0 standard drinks    Types: 7 Glasses of wine per week    Comment: with dinner  . Drug  use: No  . Sexual activity: Not Currently  Other Topics Concern  . Not on file  Social History Narrative  . Not on file   Social Determinants of Health   Financial Resource Strain:   . Difficulty of Paying Living Expenses:   Food Insecurity:   . Worried About Charity fundraiser in the Last Year:   . Arboriculturist in the Last Year:   Transportation Needs:   . Film/video editor (Medical):   Marland Kitchen Lack of Transportation (Non-Medical):   Physical Activity:   . Days of Exercise per Week:   . Minutes of Exercise per Session:   Stress:   . Feeling of Stress :   Social Connections:   . Frequency of Communication with Friends and Family:   . Frequency of Social Gatherings with Friends and Family:   . Attends Religious Services:   . Active Member of Clubs or Organizations:   . Attends Archivist Meetings:   Marland Kitchen Marital Status:   Intimate Partner Violence:   . Fear of Current or Ex-Partner:   . Emotionally Abused:   Marland Kitchen Physically Abused:   . Sexually Abused:      Constitutional: Pt reports weight gain, fatigue. Denies fever, malaise, headache.  Respiratory: Denies difficulty breathing, shortness of breath, cough or sputum production.   Cardiovascular: Denies chest pain, chest tightness, palpitations or swelling in the hands or feet.  Neurological: Denies dizziness, difficulty with memory, difficulty with speech or problems with balance and coordination.    No other specific complaints in a complete review of systems (except as listed in HPI above).     Objective:   Physical Exam   BP (!) 148/80 (BP Location: Left Arm, Patient Position: Sitting, Cuff Size: Normal)   Pulse 72   Temp (!) 97.3 F (36.3 C) (Temporal)   Ht 4' 11.5" (1.511 m)   Wt 134 lb 12.8 oz (61.1 kg)   SpO2 93%   BMI 26.77 kg/m   Wt Readings from Last 3 Encounters:  09/13/19 136 lb (61.7 kg)  06/07/19 130 lb (59 kg)  02/11/19 130 lb (59 kg)    General: Appears her stated age, well  developed, well nourished in NAD. Cardiovascular: Normal rate and rhythm.  Pulmonary/Chest: Normal effort  and positive vesicular breath sounds. No respiratory distress. No wheezes, rales or ronchi noted.  Neurological: Alert and oriented.     BMET    Component Value Date/Time   NA 136 06/07/2019 1255   K 4.3 06/07/2019 1255   CL 96 06/07/2019 1255   CO2 31 06/07/2019 1255   GLUCOSE 92 06/07/2019 1255   BUN 16 06/07/2019 1255   CREATININE 0.75 06/07/2019 1255   CALCIUM 9.9 06/07/2019 1255    Lipid Panel     Component Value Date/Time   CHOL 259 (H) 06/07/2019 1255   TRIG 72.0 06/07/2019 1255   HDL 115.30 06/07/2019 1255   CHOLHDL 2 06/07/2019 1255   VLDL 14.4 06/07/2019 1255   LDLCALC 130 (H) 06/07/2019 1255    CBC    Component Value Date/Time   WBC 5.6 06/07/2019 1255   RBC 4.59 06/07/2019 1255   HGB 15.6 (H) 06/07/2019 1255   HCT 46.8 (H) 06/07/2019 1255   PLT 208.0 06/07/2019 1255   MCV 102.0 (H) 06/07/2019 1255   MCHC 33.3 06/07/2019 1255   RDW 12.8 06/07/2019 1255    Hgb A1C No results found for: HGBA1C        Assessment & Plan:  Positive HIV Antibody:  Discussed with her that this is a false positive Will repeat HIV antibody in 3 months for confirmation  Return precautions discussed Nicki Reaper, NP This visit occurred during the SARS-CoV-2 public health emergency.  Safety protocols were in place, including screening questions prior to the visit, additional usage of staff PPE, and extensive cleaning of exam room while observing appropriate contact time as indicated for disinfecting solutions.

## 2019-09-28 NOTE — Patient Instructions (Signed)
HIV Antibody Test Why am I having this test? The HIV antibody test is used to screen for the human immunodeficiency virus (HIV), the virus that causes AIDS (acquired immunodeficiency syndrome). The Centers for Disease Control and Prevention (CDC) recommends that everyone between the ages of 8 and 34 have this test at least once. Your health care provider may recommend this test if:  You are pregnant or planning on becoming pregnant.  You have had sex with someone who is or might be HIV-positive.  You have had unprotected sex with more than one partner.  You are a Economist and were exposed to HIV-infected blood.  You have ever injected illegal drugs.  You have ever exchanged sex for money or drugs.  You have been diagnosed with hepatitis, tuberculosis, or a sexually transmitted infection (STI).  You are a man who has sex with other men (MSM).  You are exhibiting symptoms of AIDS.  You had a blood transfusion before 1985. It is possible to have HIV without any symptoms. There is no cure for HIV, but starting treatment early helps you stay healthy longer. If you know you have HIV (are HIV-positive), you can also make changes to prevent transferring the virus to other people. What is being tested? This test detects the proteins (antibodies) that your body's defense system (immune system) makes to fight the infection. Having antibodies for HIV does not mean that you have AIDS or will get AIDS. HIV tests can be performed in a clinic, lab, or hospital setting. There are also home testing kits. What kind of sample is taken?     The HIV antibody test requires either a blood sample or a sample of the fluid from inside your mouth (oral fluid).  For the blood test, a blood sample is drawn from a vein in your hand or arm or by sticking a finger with a small needle.  For the test using oral fluid, your upper and lower gums are swabbed. If you decide to do a home HIV test, follow the  package instructions carefully. There are two types of home testing kits:  Blood test. You will send your test kit that includes a small sample of your blood to the manufacturer for processing. You will receive your results from the manufacturer.  Oral fluid test. This test is completed at home and usually gives you results in 20-40 minutes. ? Up to 1 in 12 infected people may have a false-negative result with the saliva test. This means that you could have a negative result even if you are infected. How are the results reported?  Your test results will be reported as either positive or negative for antibodies of HIV. ? Sometimes, the test results may report that a condition is present when it is not present (false-positive result). ? Sometimes, the test results may report that a condition is not present when it is present (false-negative result).  Your health care provider will talk to you about doing more tests to confirm your results. What do the results mean?  If your HIV antibody test is negative, it means that there were no antibodies in your blood at the time of the test. It is important to know that it can take 1-3 months to develop antibodies after infection. If you may have been infected within the previous 3 months, your health care provider may recommend that you repeat the test after that time period.  If your HIV antibody test is positive, it means that the  test detected antibodies of HIV in your sample. You will need to have another type of test to confirm the results of the initial test. A positive result to the HIV antibody test does not necessarily mean that you will develop AIDS. Talk with your health care provider about what your results mean. Questions to ask your health care provider Ask your health care provider, or the department that is doing the test:  When will my results be ready?  How will I get my results?  What are my treatment options?  What other tests do  I need?  What are my next steps? Summary  The HIV antibody test is used to screen for the human immunodeficiency virus (HIV), the virus that causes AIDS.  It is possible to have HIV without any symptoms. There is no cure for HIV, but starting treatment early helps you stay healthy longer. If you know you have HIV (are HIV-positive), you can also make changes to prevent transferring the virus to other people.  If your HIV antibody test is negative, it means that there were no antibodies in your blood at the time of the test. It is important to know that it can take 1-3 months to develop antibodies after infection. If you may have been infected within the previous 3 months, your health care provider may recommend that you repeat the test after that time period.  If your HIV antibody test is positive, it means that the test detected antibodies of HIV in your sample. You will need to have another type of test to confirm the results of the initial test. A positive result to the HIV antibody test does not necessarily mean that you will develop AIDS. This information is not intended to replace advice given to you by your health care provider. Make sure you discuss any questions you have with your health care provider. Document Revised: 04/18/2017 Document Reviewed: 02/06/2017 Elsevier Patient Education  2020 [employer].

## 2019-09-29 ENCOUNTER — Telehealth: Payer: Self-pay | Admitting: Radiology

## 2019-09-29 NOTE — Telephone Encounter (Signed)
I called the patient to give her the price of HIV repeat testing, also gave her the health dept information. ( testing can be done free). She has decided she will not have any repeat testing done. I have canceled her lab appt, I didn't cancel the test. Please cancel the test.

## 2019-09-29 NOTE — Telephone Encounter (Signed)
noted 

## 2019-10-06 DIAGNOSIS — H401113 Primary open-angle glaucoma, right eye, severe stage: Secondary | ICD-10-CM | POA: Diagnosis not present

## 2019-10-06 DIAGNOSIS — H401122 Primary open-angle glaucoma, left eye, moderate stage: Secondary | ICD-10-CM | POA: Diagnosis not present

## 2019-11-02 ENCOUNTER — Ambulatory Visit (INDEPENDENT_AMBULATORY_CARE_PROVIDER_SITE_OTHER): Payer: Medicare Other | Admitting: Internal Medicine

## 2019-11-02 ENCOUNTER — Ambulatory Visit (INDEPENDENT_AMBULATORY_CARE_PROVIDER_SITE_OTHER)
Admission: RE | Admit: 2019-11-02 | Discharge: 2019-11-02 | Disposition: A | Discharge status: Home / Self Care | Payer: Medicare Other | Source: Ambulatory Visit | Attending: Internal Medicine | Admitting: Internal Medicine

## 2019-11-02 ENCOUNTER — Encounter: Payer: Self-pay | Admitting: Internal Medicine

## 2019-11-02 ENCOUNTER — Other Ambulatory Visit: Payer: Self-pay

## 2019-11-02 VITALS — BP 140/82 | HR 74 | Temp 97.9°F | Wt 133.0 lb

## 2019-11-02 DIAGNOSIS — R0602 Shortness of breath: Secondary | ICD-10-CM

## 2019-11-02 MED ORDER — ALBUTEROL SULFATE HFA 108 (90 BASE) MCG/ACT IN AERS: 2.0000 | INHALATION_SPRAY | Freq: Four times a day (QID) | RESPIRATORY_TRACT | 0 refills | Status: DC | PRN

## 2019-11-02 NOTE — Patient Instructions (Signed)
Shortness of Breath, Adult Shortness of breath means you have trouble breathing. Shortness of breath could be a sign of a medical problem. Follow these instructions at home:   Watch for any changes in your symptoms.  Do not use any products that contain nicotine or tobacco, such as cigarettes, e-cigarettes, and chewing tobacco.  Do not smoke. Smoking can cause shortness of breath. If you need help to quit smoking, ask your doctor.  Avoid things that can make it harder to breathe, such as: ? Mold. ? Dust. ? Air pollution. ? Chemical smells. ? Things that can cause allergy symptoms (allergens), if you have allergies.  Keep your living space clean. Use products that help remove mold and dust.  Rest as needed. Slowly return to your normal activities.  Take over-the-counter and prescription medicines only as told by your doctor. This includes oxygen therapy and inhaled medicines.  Keep all follow-up visits as told by your doctor. This is important. Contact a doctor if:  Your condition does not get better as soon as expected.  You have a hard time doing your normal activities, even after you rest.  You have new symptoms. Get help right away if:  Your shortness of breath gets worse.  You have trouble breathing when you are resting.  You feel light-headed or you pass out (faint).  You have a cough that is not helped by medicines.  You cough up blood.  You have pain with breathing.  You have pain in your chest, arms, shoulders, or belly (abdomen).  You have a fever.  You cannot walk up stairs.  You cannot exercise the way you normally do. These symptoms may represent a serious problem that is an emergency. Do not wait to see if the symptoms will go away. Get medical help right away. Call your local emergency services (911 in the [place] Do not drive yourself to the hospital. Summary  Shortness of breath is when you have trouble breathing enough air. It can be a sign of a  medical problem.  Avoid things that make it hard for you to breathe, such as smoking, pollution, mold, and dust.  Watch for any changes in your symptoms. Contact your doctor if you do not get better or you get worse. This information is not intended to replace advice given to you by your health care provider. Make sure you discuss any questions you have with your health care provider. Document Revised: 10/06/2017 Document Reviewed: 10/06/2017 Elsevier Patient Education  2020 [employer]  

## 2019-11-02 NOTE — Progress Notes (Signed)
Subjective:    Patient ID: Kara Green, female    DOB: 1940/09/23, 79 y.o.   MRN: [id number]  HPI  Pt presents to the clinic today with c/o shortness of breath. She reports this s has been progressive over the last month.  She reports that initially occurred with exertion only but is now occurring at rest.  She does have some runny nose, postnasal drip and cough but this is a chronic issue.  She takes Claritin daily as prescribed.  She was told by her ophthalmologist that she needed to stop taking Flonase due to increased intraocular pressures.  She denies chest pain, chest tightness, abdominal or back pain.  She has not taken any medications OTC for this.  She is a former smoker for 20 years.  She is not exposed any secondhand smoke at this time.  Review of Systems      Past Medical History:  Diagnosis Date  . Allergy   . Arthritis   . Cataract   . Glaucoma   . Hypertension   . IBS (irritable bowel syndrome)   . Thyroid disease   . UTI (urinary tract infection)     Current Outpatient Medications  Medication Sig Dispense Refill  . Acetaminophen (TYLENOL ARTHRITIS PAIN PO) Take by mouth daily.    Marland Kitchen amLODipine (NORVASC) 10 MG tablet Take 1 tablet (10 mg total) by mouth daily. 90 tablet 2  . APPLE CIDER VINEGAR PO Take by mouth.    . Ascorbic Acid (VITAMIN C) 100 MG tablet Take 100 mg by mouth daily.    Marland Kitchen aspirin EC 81 MG tablet Take 81 mg by mouth daily.    . Cholecalciferol (VITAMIN D) 2000 units CAPS Take 1 capsule by mouth daily.    Marland Kitchen CINNAMON PO Take by mouth.    . Coconut Oil 1000 MG CAPS Take by mouth daily.    . Collagen 500 MG CAPS Take by mouth daily.    . Garlic 10 MG CAPS Take by mouth daily.    Marland Kitchen glucosamine-chondroitin 500-400 MG tablet Take 1 tablet by mouth daily.    Marland Kitchen latanoprost (XALATAN) 0.005 % ophthalmic solution Place 1 drop into both eyes at bedtime.    Marland Kitchen levothyroxine (SYNTHROID) 125 MCG tablet TAKE 0.5 TABLETS (62.5 MCG TOTAL) BY MOUTH DAILY BEFORE  BREAKFAST. 45 tablet 2  . loratadine (KLS ALLERCLEAR) 10 MG tablet Take 10 mg by mouth daily.    . Magnesium 400 MG CAPS Take by mouth daily.    . Omega-3 Fatty Acids (FISH OIL) 1200 MG CAPS Take by mouth daily.    . Prenatal Vit-Fe Fumarate-FA (PRENATAL MULTIVITAMIN) TABS tablet Take 1 tablet by mouth daily at 12 noon.    Marland Kitchen spironolactone (ALDACTONE) 25 MG tablet Take 2 tablets (50 mg total) by mouth daily. 180 tablet 2  . timolol (BETIMOL) 0.25 % ophthalmic solution Place 1-2 drops into both eyes 2 (two) times daily.    Marland Kitchen triamcinolone cream (KENALOG) 0.1 % Apply 1 application topically 2 (two) times daily. 30 g 0  . TURMERIC PO Take 720 mg by mouth daily.    . vitamin E 100 UNIT capsule Take by mouth daily.     No current facility-administered medications for this visit.    Allergies  Allergen Reactions  . Acetazolamide Other (See Comments)    Blurred vision, Headache, fatigue, loss of appetite  . Benadryl [Diphenhydramine Hcl]     hyperactive  . Codeine   . Neurontin [Gabapentin]  unknown  . Penicillins   . Sulfa Antibiotics   . Tetracyclines & Related   . Decongestant [Oxymetazoline]     Unsure on reaction, was told to never take again     Family History  Problem Relation Age of Onset  . Arthritis Mother   . Breast cancer Mother        in 24's  . Stroke Mother   . Hypertension Mother   . Hypertension Father   . Diabetes Daughter   . Diabetes Son   . Arthritis Maternal Grandmother   . Arthritis Maternal Grandfather   . Colon cancer Neg Hx   . Esophageal cancer Neg Hx   . Inflammatory bowel disease Neg Hx   . Liver disease Neg Hx   . Pancreatic cancer Neg Hx   . Rectal cancer Neg Hx   . Stomach cancer Neg Hx     Social History   Socioeconomic History  . Marital status: Divorced    Spouse name: Not on file  . Number of children: 3  . Years of education: Not on file  . Highest education level: Not on file  Occupational History  . Occupation: retired   Tobacco Use  . Smoking status: Former Games developer  . Smokeless tobacco: Never Used  . Tobacco comment: quit 25 years ago  Vaping Use  . Vaping Use: Never used  Substance and Sexual Activity  . Alcohol use: Yes    Alcohol/week: 7.0 standard drinks    Types: 7 Glasses of wine per week    Comment: with dinner  . Drug use: No  . Sexual activity: Not Currently  Other Topics Concern  . Not on file  Social History Narrative  . Not on file   Social Determinants of Health   Financial Resource Strain:   . Difficulty of Paying Living Expenses:   Food Insecurity:   . Worried About Programme researcher, broadcasting/film/video in the Last Year:   . Barista in the Last Year:   Transportation Needs:   . Freight forwarder (Medical):   Marland Kitchen Lack of Transportation (Non-Medical):   Physical Activity:   . Days of Exercise per Week:   . Minutes of Exercise per Session:   Stress:   . Feeling of Stress :   Social Connections:   . Frequency of Communication with Friends and Family:   . Frequency of Social Gatherings with Friends and Family:   . Attends Religious Services:   . Active Member of Clubs or Organizations:   . Attends Banker Meetings:   Marland Kitchen Marital Status:   Intimate Partner Violence:   . Fear of Current or Ex-Partner:   . Emotionally Abused:   Marland Kitchen Physically Abused:   . Sexually Abused:      Constitutional: Denies fever, malaise, fatigue, headache or abrupt weight changes.  HEENT: Patient reports runny nose, postnasal drip. Denies eye pain, eye redness, ear pain, ringing in the ears, wax buildup, nasal congestion, bloody nose, or sore throat. Respiratory: Pt reports cough and shortness of breath. Denies  or sputum production.   Cardiovascular: Denies chest pain, chest tightness, palpitations or swelling in the hands or feet.   No other specific complaints in a complete review of systems (except as listed in HPI above).  Objective:   Physical Exam   BP 140/82   Pulse 74   Temp  97.9 F (36.6 C) (Temporal)   Wt 133 lb (60.3 kg)   SpO2 98%   BMI  26.41 kg/m   Wt Readings from Last 3 Encounters:  09/28/19 134 lb 12.8 oz (61.1 kg)  09/13/19 136 lb (61.7 kg)  06/07/19 130 lb (59 kg)    General: Appears her stated age, well developed, well nourished in NAD. HEENT: Head: normal shape and size; Eyes: sclera white, no icterus, conjunctiva pink, PERRLA and EOMs intact;  Neck: No adenopathy or JVD noted. Cardiovascular: Normal rate and rhythm. S1,S2 noted.  No murmur, rubs or gallops noted. No JVD or BLE edema.  Pulmonary/Chest: Normal effort and diminished breath sounds. No respiratory distress. No wheezes, rales or ronchi noted.  Musculoskeletal:  No difficulty with gait.  Neurological: Alert and oriented.   BMET    Component Value Date/Time   NA 136 06/07/2019 1255   K 4.3 06/07/2019 1255   CL 96 06/07/2019 1255   CO2 31 06/07/2019 1255   GLUCOSE 92 06/07/2019 1255   BUN 16 06/07/2019 1255   CREATININE 0.75 06/07/2019 1255   CALCIUM 9.9 06/07/2019 1255    Lipid Panel     Component Value Date/Time   CHOL 259 (H) 06/07/2019 1255   TRIG 72.0 06/07/2019 1255   HDL 115.30 06/07/2019 1255   CHOLHDL 2 06/07/2019 1255   VLDL 14.4 06/07/2019 1255   LDLCALC 130 (H) 06/07/2019 1255    CBC    Component Value Date/Time   WBC 5.6 06/07/2019 1255   RBC 4.59 06/07/2019 1255   HGB 15.6 (H) 06/07/2019 1255   HCT 46.8 (H) 06/07/2019 1255   PLT 208.0 06/07/2019 1255   MCV 102.0 (H) 06/07/2019 1255   MCHC 33.3 06/07/2019 1255   RDW 12.8 06/07/2019 1255    Hgb A1C No results found for: HGBA1C         Assessment & Plan:   Shortness of Breath:  Chest x-ray today Rx for albuterol inhaler 1 to 2 puffs every 4 to 6 hours as needed for shortness of breath Walking sats decreased to 80% on room air Consider referral to pulmonology versus cardiology for further evaluation pending x-ray  We will follow up after x-ray, return precautions  discussed  Nicki Reaper, NP This visit occurred during the SARS-CoV-2 public health emergency.  Safety protocols were in place, including screening questions prior to the visit, additional usage of staff PPE, and extensive cleaning of exam room while observing appropriate contact time as indicated for disinfecting solutions.

## 2019-11-03 ENCOUNTER — Other Ambulatory Visit: Payer: Self-pay | Admitting: Internal Medicine

## 2019-11-03 MED ORDER — PREDNISONE 10 MG PO TABS: ORAL_TABLET | ORAL | 0 refills | Status: DC

## 2019-11-03 MED ORDER — BUDESONIDE-FORMOTEROL FUMARATE 80-4.5 MCG/ACT IN AERO: 2.0000 | INHALATION_SPRAY | Freq: Two times a day (BID) | RESPIRATORY_TRACT | 12 refills | Status: DC

## 2019-11-03 NOTE — Progress Notes (Signed)
.  ped

## 2019-11-04 ENCOUNTER — Telehealth: Payer: Self-pay

## 2019-11-04 NOTE — Telephone Encounter (Addendum)
Pt left v/m that she picked up her inhaler and then got another call yesterday that rx ready for pick up. Pt wants to know what the other rx is for. Pt request cb. I spoke with Weston Brass at [address] and there is a prednisone rx ready for pick up. Pt notified as instructed per CXR result note on 11/02/19 and voiced understanding. Pt said she has glaucoma and her eye dr does not want pt to use steroids because will increase eye pressure and pt said she had taken prednisone before and did not do well on it and pt cannot remember how prednisone bothered her but she does remember she did not do well on it. Pt request cb to see what else R Baity NP would suggest. Pt did not schedule FU appt until hears back from R Baity NP. (prednisone is not under allergy/contraindication list).

## 2019-11-04 NOTE — Telephone Encounter (Signed)
She has emphysema. This needs steroid treatment to improve her symptoms. She can discuss with her ophthalmologist first if she would like.

## 2019-11-06 NOTE — Telephone Encounter (Signed)
Pt is aware as instructed and expressed understanding 

## 2019-11-11 ENCOUNTER — Ambulatory Visit
Admission: RE | Admit: 2019-11-11 | Discharge: 2019-11-11 | Disposition: A | Discharge status: Home / Self Care | Payer: Medicare Other | Source: Ambulatory Visit | Attending: Internal Medicine | Admitting: Internal Medicine

## 2019-11-11 ENCOUNTER — Other Ambulatory Visit: Payer: Self-pay

## 2019-11-11 DIAGNOSIS — Z780 Asymptomatic menopausal state: Secondary | ICD-10-CM

## 2019-12-02 ENCOUNTER — Encounter: Payer: Self-pay | Admitting: Internal Medicine

## 2019-12-02 ENCOUNTER — Ambulatory Visit (INDEPENDENT_AMBULATORY_CARE_PROVIDER_SITE_OTHER): Payer: Medicare Other | Admitting: Internal Medicine

## 2019-12-02 ENCOUNTER — Other Ambulatory Visit: Payer: Medicare Other

## 2019-12-02 ENCOUNTER — Other Ambulatory Visit: Payer: Self-pay

## 2019-12-02 VITALS — BP 140/78 | HR 76 | Temp 98.2°F | Wt 129.0 lb

## 2019-12-02 DIAGNOSIS — J439 Emphysema, unspecified: Secondary | ICD-10-CM

## 2019-12-02 DIAGNOSIS — R61 Generalized hyperhidrosis: Secondary | ICD-10-CM | POA: Diagnosis not present

## 2019-12-02 DIAGNOSIS — E039 Hypothyroidism, unspecified: Secondary | ICD-10-CM

## 2019-12-02 LAB — T4, FREE: Free T4: 1.35 ng/dL (ref 0.60–1.60)

## 2019-12-02 LAB — TSH: TSH: 0.26 u[IU]/mL — ABNORMAL LOW (ref 0.35–4.50)

## 2019-12-02 MED ORDER — SPACER/AERO-HOLDING CHAMBERS DEVI: 1.0000 | Freq: Two times a day (BID) | 0 refills | Status: DC

## 2019-12-02 NOTE — Patient Instructions (Signed)
Chronic Obstructive Pulmonary Disease Chronic obstructive pulmonary disease (COPD) is a long-term (chronic) lung problem. When you have COPD, it is hard for air to get in and out of your lungs. Usually the condition gets worse over time, and your lungs will never return to normal. There are things you can do to keep yourself as healthy as possible.  Your doctor may treat your condition with: ? Medicines. ? Oxygen. ? Lung surgery.  Your doctor may also recommend: ? Rehabilitation. This includes steps to make your body work better. It may involve a team of specialists. ? Quitting smoking, if you smoke. ? Exercise and changes to your diet. ? Comfort measures (palliative care). Follow these instructions at home: Medicines  Take over-the-counter and prescription medicines only as told by your doctor.  Talk to your doctor before taking any cough or allergy medicines. You may need to avoid medicines that cause your lungs to be dry. Lifestyle  If you smoke, stop. Smoking makes the problem worse. If you need help quitting, ask your doctor.  Avoid being around things that make your breathing worse. This may include smoke, chemicals, and fumes.  Stay active, but remember to rest as well.  Learn and use tips on how to relax.  Make sure you get enough sleep. Most adults need at least 7 hours of sleep every night.  Eat healthy foods. Eat smaller meals more often. Rest before meals. Controlled breathing Learn and use tips on how to control your breathing as told by your doctor. Try:  Breathing in (inhaling) through your nose for 1 second. Then, pucker your lips and breath out (exhale) through your lips for 2 seconds.  Putting one hand on your belly (abdomen). Breathe in slowly through your nose for 1 second. Your hand on your belly should move out. Pucker your lips and breathe out slowly through your lips. Your hand on your belly should move in as you breathe out.  Controlled coughing Learn  and use controlled coughing to clear mucus from your lungs. Follow these steps: 1. Lean your head a little forward. 2. Breathe in deeply. 3. Try to hold your breath for 3 seconds. 4. Keep your mouth slightly open while coughing 2 times. 5. Spit any mucus out into a tissue. 6. Rest and do the steps again 1 or 2 times as needed. General instructions  Make sure you get all the shots (vaccines) that your doctor recommends. Ask your doctor about a flu shot and a pneumonia shot.  Use oxygen therapy and pulmonary rehabilitation if told by your doctor. If you need home oxygen therapy, ask your doctor if you should buy a tool to measure your oxygen level (oximeter).  Make a COPD action plan with your doctor. This helps you to know what to do if you feel worse than usual.  Manage any other conditions you have as told by your doctor.  Avoid going outside when it is very hot, cold, or humid.  Avoid people who have a sickness you can catch (contagious).  Keep all follow-up visits as told by your doctor. This is important. Contact a doctor if:  You cough up more mucus than usual.  There is a change in the color or thickness of the mucus.  It is harder to breathe than usual.  Your breathing is faster than usual.  You have trouble sleeping.  You need to use your medicines more often than usual.  You have trouble doing your normal activities such as getting dressed   or walking around the house. Get help right away if:  You have shortness of breath while resting.  You have shortness of breath that stops you from: ? Being able to talk. ? Doing normal activities.  Your chest hurts for longer than 5 minutes.  Your skin color is more blue than usual.  Your pulse oximeter shows that you have low oxygen for longer than 5 minutes.  You have a fever.  You feel too tired to breathe normally. Summary  Chronic obstructive pulmonary disease (COPD) is a long-term lung problem.  The way your  lungs work will never return to normal. Usually the condition gets worse over time. There are things you can do to keep yourself as healthy as possible.  Take over-the-counter and prescription medicines only as told by your doctor.  If you smoke, stop. Smoking makes the problem worse. This information is not intended to replace advice given to you by your health care provider. Make sure you discuss any questions you have with your health care provider. Document Revised: 04/18/2017 Document Reviewed: 06/10/2016 Elsevier Patient Education  2020 Elsevier Inc[employer]  

## 2019-12-02 NOTE — Progress Notes (Signed)
Subjective:    Patient ID: Kara Green, female    DOB: 09/18/1940, 79 y.o.   MRN: [id number]  HPI  Pt presents to the clinic today for follow up of SOB. She was seen 1 month ago for the same, chest xray concerning for emphysema. She was treated with oral Prednisone and Symbicort. She has been taking the medication as prescribed. She reports significant improvement in her symptoms. 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 She wonders if she should be seeing a pulmonologist.  She is also concerned about generalized excessive sweating. She repots this has been going on for months to years. She reports it is not necessarily hot flashes but just excessive sweating. She has never taken any medication for this.  Review of Systems      Past Medical History:  Diagnosis Date  . Allergy   . Arthritis   . Cataract   . Glaucoma   . Hypertension   . IBS (irritable bowel syndrome)   . Thyroid disease   . UTI (urinary tract infection)     Current Outpatient Medications  Medication Sig Dispense Refill  . Acetaminophen (TYLENOL ARTHRITIS PAIN PO) Take by mouth daily.    Marland Kitchen albuterol (VENTOLIN HFA) 108 (90 Base) MCG/ACT inhaler Inhale 2 puffs into the lungs every 6 (six) hours as needed for wheezing or shortness of breath. 8 g 0  . amLODipine (NORVASC) 10 MG tablet Take 1 tablet (10 mg total) by mouth daily. 90 tablet 2  . APPLE CIDER VINEGAR PO Take by mouth.    . Ascorbic Acid (VITAMIN C) 100 MG tablet Take 100 mg by mouth daily.    Marland Kitchen aspirin EC 81 MG tablet Take 81 mg by mouth daily.    . budesonide-formoterol (SYMBICORT) 80-4.5 MCG/ACT inhaler Inhale 2 puffs into the lungs in the morning and at bedtime. 1 Inhaler 12  . Cholecalciferol (VITAMIN D) 2000 units CAPS Take 1 capsule by mouth daily.    Marland Kitchen CINNAMON PO Take by mouth.    . Coconut Oil 1000 MG CAPS Take by mouth daily.    . Collagen 500 MG CAPS Take by mouth daily.    . Garlic 10 MG CAPS  Take by mouth daily.    Marland Kitchen glucosamine-chondroitin 500-400 MG tablet Take 1 tablet by mouth daily.    Marland Kitchen latanoprost (XALATAN) 0.005 % ophthalmic solution Place 1 drop into both eyes at bedtime.    Marland Kitchen levothyroxine (SYNTHROID) 125 MCG tablet TAKE 0.5 TABLETS (62.5 MCG TOTAL) BY MOUTH DAILY BEFORE BREAKFAST. 45 tablet 2  . loratadine (KLS ALLERCLEAR) 10 MG tablet Take 10 mg by mouth daily.    . Magnesium 400 MG CAPS Take by mouth daily.    . Omega-3 Fatty Acids (FISH OIL) 1200 MG CAPS Take by mouth daily.    . predniSONE (DELTASONE) 10 MG tablet Take 6 tabs day 1, 5 tabs day 2, 4 tabs day 3, 3 tabs day 4, 2 tabs day 5, 1 tab day 6 21 tablet 0  . Prenatal Vit-Fe Fumarate-FA (PRENATAL MULTIVITAMIN) TABS tablet Take 1 tablet by mouth daily at 12 noon.    Marland Kitchen spironolactone (ALDACTONE) 25 MG tablet Take 2 tablets (50 mg total) by mouth daily. 180 tablet 2  . timolol (BETIMOL) 0.25 % ophthalmic solution Place 1-2 drops into both eyes 2 (two) times daily.    Marland Kitchen triamcinolone cream (KENALOG) 0.1 % Apply 1 application topically 2 (two) times daily. 30  g 0  . TURMERIC PO Take 720 mg by mouth daily.    . vitamin E 100 UNIT capsule Take by mouth daily.     No current facility-administered medications for this visit.    Allergies  Allergen Reactions  . Acetazolamide Other (See Comments)    Blurred vision, Headache, fatigue, loss of appetite  . Benadryl [Diphenhydramine Hcl]     hyperactive  . Codeine   . Neurontin [Gabapentin]     unknown  . Penicillins   . Sulfa Antibiotics   . Tetracyclines & Related   . Decongestant [Oxymetazoline]     Unsure on reaction, was told to never take again     Family History  Problem Relation Age of Onset  . Arthritis Mother   . Breast cancer Mother        in 71's  . Stroke Mother   . Hypertension Mother   . Hypertension Father   . Diabetes Daughter   . Diabetes Son   . Arthritis Maternal Grandmother   . Arthritis Maternal Grandfather   . Colon cancer Neg Hx    . Esophageal cancer Neg Hx   . Inflammatory bowel disease Neg Hx   . Liver disease Neg Hx   . Pancreatic cancer Neg Hx   . Rectal cancer Neg Hx   . Stomach cancer Neg Hx     Social History   Socioeconomic History  . Marital status: Divorced    Spouse name: Not on file  . Number of children: 3  . Years of education: Not on file  . Highest education level: Not on file  Occupational History  . Occupation: retired  Tobacco Use  . Smoking status: Former Games developer  . Smokeless tobacco: Never Used  . Tobacco comment: quit 25 years ago  Vaping Use  . Vaping Use: Never used  Substance and Sexual Activity  . Alcohol use: Yes    Alcohol/week: 7.0 standard drinks    Types: 7 Glasses of wine per week    Comment: with dinner  . Drug use: No  . Sexual activity: Not Currently  Other Topics Concern  . Not on file  Social History Narrative  . Not on file   Social Determinants of Health   Financial Resource Strain:   . Difficulty of Paying Living Expenses:   Food Insecurity:   . Worried About Programme researcher, broadcasting/film/video in the Last Year:   . Barista in the Last Year:   Transportation Needs:   . Freight forwarder (Medical):   Marland Kitchen Lack of Transportation (Non-Medical):   Physical Activity:   . Days of Exercise per Week:   . Minutes of Exercise per Session:   Stress:   . Feeling of Stress :   Social Connections:   . Frequency of Communication with Friends and Family:   . Frequency of Social Gatherings with Friends and Family:   . Attends Religious Services:   . Active Member of Clubs or Organizations:   . Attends Banker Meetings:   Marland Kitchen Marital Status:   Intimate Partner Violence:   . Fear of Current or Ex-Partner:   . Emotionally Abused:   Marland Kitchen Physically Abused:   . Sexually Abused:      Constitutional: Denies fever, malaise, fatigue, headache or abrupt weight changes.  HEENT: Denies eye pain, eye redness, ear pain, ringing in the ears, wax buildup, runny  nose, nasal congestion, bloody nose, or sore throat. Respiratory: Denies difficulty breathing, shortness of  breath, cough or sputum production.   Cardiovascular: Denies chest pain, chest tightness, palpitations or swelling in the hands or feet.  Skin: Pt reports excessive sweating.   No other specific complaints in a complete review of systems (except as listed in HPI above).  Objective:   Physical Exam  BP 140/78   Pulse 76   Temp 98.2 F (36.8 C) (Temporal)   Wt 129 lb (58.5 kg)   SpO2 97%   BMI 25.62 kg/m   Wt Readings from Last 3 Encounters:  11/02/19 133 lb (60.3 kg)  09/28/19 134 lb 12.8 oz (61.1 kg)  09/13/19 136 lb (61.7 kg)    General: Appears their stated age, well developed, well nourished in NAD. Skin: Warm, dry and intact.  Cardiovascular: Normal rate and rhythm. S1,S2 noted.  No murmur, rubs or gallops noted.  Pulmonary/Chest: Normal effort and positive vesicular breath sounds. No respiratory distress. No wheezes, rales or ronchi noted.  Neurological: Alert and oriented.   BMET    Component Value Date/Time   NA 136 06/07/2019 1255   K 4.3 06/07/2019 1255   CL 96 06/07/2019 1255   CO2 31 06/07/2019 1255   GLUCOSE 92 06/07/2019 1255   BUN 16 06/07/2019 1255   CREATININE 0.75 06/07/2019 1255   CALCIUM 9.9 06/07/2019 1255    Lipid Panel     Component Value Date/Time   CHOL 259 (H) 06/07/2019 1255   TRIG 72.0 06/07/2019 1255   HDL 115.30 06/07/2019 1255   CHOLHDL 2 06/07/2019 1255   VLDL 14.4 06/07/2019 1255   LDLCALC 130 (H) 06/07/2019 1255    CBC    Component Value Date/Time   WBC 5.6 06/07/2019 1255   RBC 4.59 06/07/2019 1255   HGB 15.6 (H) 06/07/2019 1255   HCT 46.8 (H) 06/07/2019 1255   PLT 208.0 06/07/2019 1255   MCV 102.0 (H) 06/07/2019 1255   MCHC 33.3 06/07/2019 1255   RDW 12.8 06/07/2019 1255    Hgb A1C No results found for: HGBA1C          Assessment & Plan:   Excessive Sweating:  Will check TSH and Free T4 Consider  Glycopyrrolate- she will review this with optho  Will follow up after labs, return precautions discussed Nicki Reaper, NP This visit occurred during the SARS-CoV-2 public health emergency.  Safety protocols were in place, including screening questions prior to the visit, additional usage of staff PPE, and extensive cleaning of exam room while observing appropriate contact time as indicated for disinfecting solutions.

## 2019-12-02 NOTE — Assessment & Plan Note (Signed)
Decrease Symbicort to 1 puff BID RX for Spacer No indication for referral pulmonology at this time

## 2019-12-03 NOTE — Addendum Note (Signed)
Addended by: Lorre Munroe on: 12/03/2019 11:02 AM   Modules accepted: Orders

## 2019-12-06 MED ORDER — LEVOTHYROXINE SODIUM 50 MCG PO TABS: 50.0000 ug | ORAL_TABLET | Freq: Every day | ORAL | 0 refills | Status: DC

## 2019-12-06 NOTE — Addendum Note (Signed)
Addended by: Roena Malady on: 12/06/2019 04:44 PM   Modules accepted: Orders

## 2019-12-13 DIAGNOSIS — H401113 Primary open-angle glaucoma, right eye, severe stage: Secondary | ICD-10-CM | POA: Diagnosis not present

## 2019-12-13 DIAGNOSIS — H401122 Primary open-angle glaucoma, left eye, moderate stage: Secondary | ICD-10-CM | POA: Diagnosis not present

## 2019-12-29 ENCOUNTER — Other Ambulatory Visit: Payer: Self-pay | Admitting: Internal Medicine

## 2020-01-06 ENCOUNTER — Other Ambulatory Visit: Payer: Medicare Other

## 2020-01-06 ENCOUNTER — Encounter: Payer: Self-pay | Admitting: Internal Medicine

## 2020-01-06 ENCOUNTER — Other Ambulatory Visit: Payer: Self-pay

## 2020-01-06 ENCOUNTER — Ambulatory Visit (INDEPENDENT_AMBULATORY_CARE_PROVIDER_SITE_OTHER): Payer: Medicare Other | Admitting: Internal Medicine

## 2020-01-06 VITALS — BP 140/76 | HR 67 | Temp 98.0°F | Wt 129.0 lb

## 2020-01-06 DIAGNOSIS — J439 Emphysema, unspecified: Secondary | ICD-10-CM

## 2020-01-06 DIAGNOSIS — E039 Hypothyroidism, unspecified: Secondary | ICD-10-CM

## 2020-01-06 DIAGNOSIS — T7840XA Allergy, unspecified, initial encounter: Secondary | ICD-10-CM | POA: Diagnosis not present

## 2020-01-06 DIAGNOSIS — R61 Generalized hyperhidrosis: Secondary | ICD-10-CM | POA: Diagnosis not present

## 2020-01-06 DIAGNOSIS — R232 Flushing: Secondary | ICD-10-CM | POA: Diagnosis not present

## 2020-01-06 LAB — TSH: TSH: 1.15 u[IU]/mL (ref 0.35–4.50)

## 2020-01-06 MED ORDER — LEVOTHYROXINE SODIUM 50 MCG PO TABS: 50.0000 ug | ORAL_TABLET | Freq: Every day | ORAL | 1 refills | Status: DC

## 2020-01-06 NOTE — Addendum Note (Signed)
Addended by: Lorre Munroe on: 01/06/2020 02:53 PM   Modules accepted: Orders

## 2020-01-06 NOTE — Patient Instructions (Signed)
Chronic Obstructive Pulmonary Disease Chronic obstructive pulmonary disease (COPD) is a long-term (chronic) lung problem. When you have COPD, it is hard for air to get in and out of your lungs. Usually the condition gets worse over time, and your lungs will never return to normal. There are things you can do to keep yourself as healthy as possible.  Your doctor may treat your condition with: ? Medicines. ? Oxygen. ? Lung surgery.  Your doctor may also recommend: ? Rehabilitation. This includes steps to make your body work better. It may involve a team of specialists. ? Quitting smoking, if you smoke. ? Exercise and changes to your diet. ? Comfort measures (palliative care). Follow these instructions at home: Medicines  Take over-the-counter and prescription medicines only as told by your doctor.  Talk to your doctor before taking any cough or allergy medicines. You may need to avoid medicines that cause your lungs to be dry. Lifestyle  If you smoke, stop. Smoking makes the problem worse. If you need help quitting, ask your doctor.  Avoid being around things that make your breathing worse. This may include smoke, chemicals, and fumes.  Stay active, but remember to rest as well.  Learn and use tips on how to relax.  Make sure you get enough sleep. Most adults need at least 7 hours of sleep every night.  Eat healthy foods. Eat smaller meals more often. Rest before meals. Controlled breathing Learn and use tips on how to control your breathing as told by your doctor. Try:  Breathing in (inhaling) through your nose for 1 second. Then, pucker your lips and breath out (exhale) through your lips for 2 seconds.  Putting one hand on your belly (abdomen). Breathe in slowly through your nose for 1 second. Your hand on your belly should move out. Pucker your lips and breathe out slowly through your lips. Your hand on your belly should move in as you breathe out.  Controlled coughing Learn  and use controlled coughing to clear mucus from your lungs. Follow these steps: 1. Lean your head a little forward. 2. Breathe in deeply. 3. Try to hold your breath for 3 seconds. 4. Keep your mouth slightly open while coughing 2 times. 5. Spit any mucus out into a tissue. 6. Rest and do the steps again 1 or 2 times as needed. General instructions  Make sure you get all the shots (vaccines) that your doctor recommends. Ask your doctor about a flu shot and a pneumonia shot.  Use oxygen therapy and pulmonary rehabilitation if told by your doctor. If you need home oxygen therapy, ask your doctor if you should buy a tool to measure your oxygen level (oximeter).  Make a COPD action plan with your doctor. This helps you to know what to do if you feel worse than usual.  Manage any other conditions you have as told by your doctor.  Avoid going outside when it is very hot, cold, or humid.  Avoid people who have a sickness you can catch (contagious).  Keep all follow-up visits as told by your doctor. This is important. Contact a doctor if:  You cough up more mucus than usual.  There is a change in the color or thickness of the mucus.  It is harder to breathe than usual.  Your breathing is faster than usual.  You have trouble sleeping.  You need to use your medicines more often than usual.  You have trouble doing your normal activities such as getting dressed   or walking around the house. Get help right away if:  You have shortness of breath while resting.  You have shortness of breath that stops you from: ? Being able to talk. ? Doing normal activities.  Your chest hurts for longer than 5 minutes.  Your skin color is more blue than usual.  Your pulse oximeter shows that you have low oxygen for longer than 5 minutes.  You have a fever.  You feel too tired to breathe normally. Summary  Chronic obstructive pulmonary disease (COPD) is a long-term lung problem.  The way your  lungs work will never return to normal. Usually the condition gets worse over time. There are things you can do to keep yourself as healthy as possible.  Take over-the-counter and prescription medicines only as told by your doctor.  If you smoke, stop. Smoking makes the problem worse. This information is not intended to replace advice given to you by your health care provider. Make sure you discuss any questions you have with your health care provider. Document Revised: 04/18/2017 Document Reviewed: 06/10/2016 Elsevier Patient Education  2020 Elsevier Inc[employer]  

## 2020-01-06 NOTE — Progress Notes (Signed)
Subjective:    Patient ID: Kara Green, female    DOB: 1940/07/19, 79 y.o.   MRN: [id number]  HPI  Pt presents to the clinic today for follow up labs. Her Levothyroxine was decreased to 50 mcg daily. She has been taking the medication as prescribed and denies adverse effects. She had been having some hot flashes, which has not gotten better.  She also wants to know when she can come off the Symbicort. She was recently diagnosed with emphysema.  She also reports intermittent right ear pain, post nasal drip. She denies runny nose, nasal congestion, sore throat. She takes Claritin daily.  Review of Systems   Past Medical History:  Diagnosis Date  . Allergy   . Arthritis   . Cataract   . Glaucoma   . Hypertension   . IBS (irritable bowel syndrome)   . Thyroid disease   . UTI (urinary tract infection)     Current Outpatient Medications  Medication Sig Dispense Refill  . Acetaminophen (TYLENOL ARTHRITIS PAIN PO) Take by mouth daily.    Marland Kitchen albuterol (VENTOLIN HFA) 108 (90 Base) MCG/ACT inhaler Inhale 2 puffs into the lungs every 6 (six) hours as needed for wheezing or shortness of breath. 8 g 0  . amLODipine (NORVASC) 10 MG tablet Take 1 tablet (10 mg total) by mouth daily. 90 tablet 2  . APPLE CIDER VINEGAR PO Take by mouth.    . Ascorbic Acid (VITAMIN C) 100 MG tablet Take 100 mg by mouth daily.    Marland Kitchen aspirin EC 81 MG tablet Take 81 mg by mouth daily.    . budesonide-formoterol (SYMBICORT) 80-4.5 MCG/ACT inhaler Inhale 2 puffs into the lungs in the morning and at bedtime. 1 Inhaler 12  . Cholecalciferol (VITAMIN D) 2000 units CAPS Take 1 capsule by mouth daily.    Marland Kitchen CINNAMON PO Take by mouth.    . Coconut Oil 1000 MG CAPS Take by mouth daily.    . Collagen 500 MG CAPS Take by mouth daily.    . Garlic 10 MG CAPS Take by mouth daily.    Marland Kitchen glucosamine-chondroitin 500-400 MG tablet Take 1 tablet by mouth daily.    Marland Kitchen latanoprost (XALATAN) 0.005 % ophthalmic solution Place 1 drop  into both eyes at bedtime.    Marland Kitchen levothyroxine (SYNTHROID) 50 MCG tablet Take 1 tablet (50 mcg total) by mouth daily. 30 tablet 0  . loratadine (KLS ALLERCLEAR) 10 MG tablet Take 10 mg by mouth daily.    . Magnesium 400 MG CAPS Take by mouth daily.    . Omega-3 Fatty Acids (FISH OIL) 1200 MG CAPS Take by mouth daily.    . Prenatal Vit-Fe Fumarate-FA (PRENATAL MULTIVITAMIN) TABS tablet Take 1 tablet by mouth daily at 12 noon.    Marland Kitchen Spacer/Aero-Holding Chambers DEVI 1 Device by Does not apply route in the morning and at bedtime. 1 Device 0  . spironolactone (ALDACTONE) 25 MG tablet Take 2 tablets (50 mg total) by mouth daily. 180 tablet 2  . timolol (BETIMOL) 0.25 % ophthalmic solution Place 1-2 drops into both eyes 2 (two) times daily.    Marland Kitchen triamcinolone cream (KENALOG) 0.1 % Apply 1 application topically 2 (two) times daily. 30 g 0  . TURMERIC PO Take 720 mg by mouth daily.    . vitamin E 100 UNIT capsule Take by mouth daily.     No current facility-administered medications for this visit.    Allergies  Allergen Reactions  . Acetazolamide Other (See  Comments)    Blurred vision, Headache, fatigue, loss of appetite  . Benadryl [Diphenhydramine Hcl]     hyperactive  . Codeine   . Neurontin [Gabapentin]     unknown  . Penicillins   . Sulfa Antibiotics   . Tetracyclines & Related   . Decongestant [Oxymetazoline]     Unsure on reaction, was told to never take again     Family History  Problem Relation Age of Onset  . Arthritis Mother   . Breast cancer Mother        in 60's  . Stroke Mother   . Hypertension Mother   . Hypertension Father   . Diabetes Daughter   . Diabetes Son   . Arthritis Maternal Grandmother   . Arthritis Maternal Grandfather   . Colon cancer Neg Hx   . Esophageal cancer Neg Hx   . Inflammatory bowel disease Neg Hx   . Liver disease Neg Hx   . Pancreatic cancer Neg Hx   . Rectal cancer Neg Hx   . Stomach cancer Neg Hx     Social History    Socioeconomic History  . Marital status: Divorced    Spouse name: Not on file  . Number of children: 3  . Years of education: Not on file  . Highest education level: Not on file  Occupational History  . Occupation: retired  Tobacco Use  . Smoking status: Former Games developer  . Smokeless tobacco: Never Used  . Tobacco comment: quit 25 years ago  Vaping Use  . Vaping Use: Never used  Substance and Sexual Activity  . Alcohol use: Yes    Alcohol/week: 7.0 standard drinks    Types: 7 Glasses of wine per week    Comment: with dinner  . Drug use: No  . Sexual activity: Not Currently  Other Topics Concern  . Not on file  Social History Narrative  . Not on file   Social Determinants of Health   Financial Resource Strain:   . Difficulty of Paying Living Expenses: Not on file  Food Insecurity:   . Worried About Programme researcher, broadcasting/film/video in the Last Year: Not on file  . Ran Out of Food in the Last Year: Not on file  Transportation Needs:   . Lack of Transportation (Medical): Not on file  . Lack of Transportation (Non-Medical): Not on file  Physical Activity:   . Days of Exercise per Week: Not on file  . Minutes of Exercise per Session: Not on file  Stress:   . Feeling of Stress : Not on file  Social Connections:   . Frequency of Communication with Friends and Family: Not on file  . Frequency of Social Gatherings with Friends and Family: Not on file  . Attends Religious Services: Not on file  . Active Member of Clubs or Organizations: Not on file  . Attends Banker Meetings: Not on file  . Marital Status: Not on file  Intimate Partner Violence:   . Fear of Current or Ex-Partner: Not on file  . Emotionally Abused: Not on file  . Physically Abused: Not on file  . Sexually Abused: Not on file     Constitutional: Denies fever, malaise, fatigue, headache or abrupt weight changes.  HEENT: Pt reports intermittent right ear pain, post nasal drip. Denies eye pain, eye redness,  ringing in the ears, wax buildup, runny nose, nasal congestion, bloody nose, or sore throat. Respiratory: Denies difficulty breathing, shortness of breath, cough or sputum production.  Cardiovascular: Denies chest pain, chest tightness, palpitations or swelling in the hands or feet.  Skin: Denies redness, rashes, lesions or ulcercations.  Neurological: Pt reports hot flashes. Denies dizziness, difficulty with memory, difficulty with speech or problems with balance and coordination.    No other specific complaints in a complete review of systems (except as listed in HPI above).   Objective:   Physical Exam  BP 140/76   Pulse 67   Temp 98 F (36.7 C) (Temporal)   Wt 129 lb (58.5 kg)   SpO2 98%   BMI 25.62 kg/m   Wt Readings from Last 3 Encounters:  12/02/19 129 lb (58.5 kg)  11/02/19 133 lb (60.3 kg)  09/28/19 134 lb 12.8 oz (61.1 kg)    General: Appears her stated age, well developed, well nourished in NAD. Skin: Warm, dry and intact. No rashes noted. HEENT: Head: normal shape and size; Eyes: sclera white, no icterus, conjunctiva pink, PERRLA and EOMs intact; Ears: Tm's gray and intact, normal light reflex; Throat/Mouth: Teeth present, mucosa pink and moist, + PND, no exudate, lesions or ulcerations noted.  Neck:  Neck supple, trachea midline. No masses, lumps or thyromegaly present.  Cardiovascular: Normal rate and rhythm. S1,S2 noted.  No murmur, rubs or gallops noted.  Pulmonary/Chest: Normal effort and positive vesicular breath sounds. No respiratory distress. No wheezes, rales or ronchi noted.  Neurological: Alert and oriented.    BMET    Component Value Date/Time   NA 136 06/07/2019 1255   K 4.3 06/07/2019 1255   CL 96 06/07/2019 1255   CO2 31 06/07/2019 1255   GLUCOSE 92 06/07/2019 1255   BUN 16 06/07/2019 1255   CREATININE 0.75 06/07/2019 1255   CALCIUM 9.9 06/07/2019 1255    Lipid Panel     Component Value Date/Time   CHOL 259 (H) 06/07/2019 1255   TRIG  72.0 06/07/2019 1255   HDL 115.30 06/07/2019 1255   CHOLHDL 2 06/07/2019 1255   VLDL 14.4 06/07/2019 1255   LDLCALC 130 (H) 06/07/2019 1255    CBC    Component Value Date/Time   WBC 5.6 06/07/2019 1255   RBC 4.59 06/07/2019 1255   HGB 15.6 (H) 06/07/2019 1255   HCT 46.8 (H) 06/07/2019 1255   PLT 208.0 06/07/2019 1255   MCV 102.0 (H) 06/07/2019 1255   MCHC 33.3 06/07/2019 1255   RDW 12.8 06/07/2019 1255    Hgb A1C No results found for: HGBA1C         Assessment & Plan:   Hypothyroidism:  Repeat TSH today Will adjust Levothyroxine if needed based on labs  Hot Flashes:  Discussed restarting HRT- she would like to hold off at this time, I agree  Emphysema:  Will decreased Symbicort to 1 puff 1 x day x 2 weeks, if no symptoms, can stop all together She is aware if symptoms return, she will need to restart Symbicort  Allergies:  Continue Claritin in am Add Allegra and Flonase in pm  Will follow up after labs, return precautions discussed Nicki Reaper, NP This visit occurred during the SARS-CoV-2 public health emergency.  Safety protocols were in place, including screening questions prior to the visit, additional usage of staff PPE, and extensive cleaning of exam room while observing appropriate contact time as indicated for disinfecting solutions.

## 2020-01-28 DIAGNOSIS — Z23 Encounter for immunization: Secondary | ICD-10-CM | POA: Diagnosis not present

## 2020-03-20 ENCOUNTER — Ambulatory Visit: Payer: Medicare Other | Admitting: Internal Medicine

## 2020-03-28 ENCOUNTER — Ambulatory Visit (INDEPENDENT_AMBULATORY_CARE_PROVIDER_SITE_OTHER): Payer: Medicare Other | Admitting: Internal Medicine

## 2020-03-28 ENCOUNTER — Ambulatory Visit (INDEPENDENT_AMBULATORY_CARE_PROVIDER_SITE_OTHER)
Admission: RE | Admit: 2020-03-28 | Discharge: 2020-03-28 | Disposition: A | Discharge status: Home / Self Care | Payer: Medicare Other | Source: Ambulatory Visit | Attending: Internal Medicine | Admitting: Internal Medicine

## 2020-03-28 ENCOUNTER — Other Ambulatory Visit: Payer: Self-pay | Admitting: Internal Medicine

## 2020-03-28 ENCOUNTER — Other Ambulatory Visit: Payer: Self-pay

## 2020-03-28 ENCOUNTER — Encounter: Payer: Self-pay | Admitting: Internal Medicine

## 2020-03-28 VITALS — BP 120/60 | HR 70 | Temp 97.5°F | Resp 16 | Wt 126.1 lb

## 2020-03-28 DIAGNOSIS — R102 Pelvic and perineal pain: Secondary | ICD-10-CM | POA: Diagnosis not present

## 2020-03-28 DIAGNOSIS — M25562 Pain in left knee: Secondary | ICD-10-CM

## 2020-03-28 DIAGNOSIS — M899 Disorder of bone, unspecified: Secondary | ICD-10-CM

## 2020-03-28 DIAGNOSIS — I878 Other specified disorders of veins: Secondary | ICD-10-CM | POA: Diagnosis not present

## 2020-03-28 NOTE — Patient Instructions (Signed)

## 2020-03-28 NOTE — Progress Notes (Signed)
Subjective:    Patient ID: Kara Green, female    DOB: 06-06-1940, 79 y.o.   MRN: [id number]  HPI  Pt presents to the clinic today with c/o pubic bone pain. She reports this started about 1.5 years ago. The pain is intermittent. She describes the pain as sharp, worse with ambulation. She denies any injury to the area. She denies left hip or left leg pain. She denies any issues with her bowel or bladder. She has not noticed any rashes in the pubic area. She has not taken anything OTC for this.  She also reports left knee pain and popping. She reports she hit her left knee on her pedestal bed about 2 weeks ago. She describes the pain as sharp, worse with movement. She denies numbness, tingling or weakness of the LLE. She has not tried anything OTC for this.  Review of Systems  Past Medical History:  Diagnosis Date  . Allergy   . Arthritis   . Cataract   . Glaucoma   . Hypertension   . IBS (irritable bowel syndrome)   . Thyroid disease   . UTI (urinary tract infection)     Current Outpatient Medications  Medication Sig Dispense Refill  . Acetaminophen (TYLENOL ARTHRITIS PAIN PO) Take by mouth daily.    Marland Kitchen amLODipine (NORVASC) 10 MG tablet Take 1 tablet (10 mg total) by mouth daily. 90 tablet 2  . APPLE CIDER VINEGAR PO Take by mouth.    . Ascorbic Acid (VITAMIN C) 100 MG tablet Take 100 mg by mouth daily.    Marland Kitchen aspirin EC 81 MG tablet Take 81 mg by mouth daily.    . Cholecalciferol (VITAMIN D) 2000 units CAPS Take 1 capsule by mouth daily.    Marland Kitchen CINNAMON PO Take by mouth.    . Coconut Oil 1000 MG CAPS Take by mouth daily.    . Collagen 500 MG CAPS Take by mouth daily.    . Garlic 10 MG CAPS Take by mouth daily.    Marland Kitchen glucosamine-chondroitin 500-400 MG tablet Take 1 tablet by mouth daily.    Marland Kitchen latanoprost (XALATAN) 0.005 % ophthalmic solution Place 1 drop into both eyes at bedtime.    Marland Kitchen levothyroxine (SYNTHROID) 50 MCG tablet Take 1 tablet (50 mcg total) by mouth daily. 90  tablet 1  . loratadine (KLS ALLERCLEAR) 10 MG tablet Take 10 mg by mouth daily.     . Magnesium 400 MG CAPS Take by mouth daily.    . naproxen sodium (ALEVE) 220 MG tablet Take 220 mg by mouth daily as needed (on rare occasion as needed for pain).    . Omega-3 Fatty Acids (FISH OIL) 1200 MG CAPS Take by mouth daily.    . Prenatal Vit-Fe Fumarate-FA (PRENATAL MULTIVITAMIN) TABS tablet Take 1 tablet by mouth daily at 12 noon.    Marland Kitchen spironolactone (ALDACTONE) 25 MG tablet Take 2 tablets (50 mg total) by mouth daily. 180 tablet 2  . timolol (BETIMOL) 0.25 % ophthalmic solution Place 1-2 drops into both eyes 2 (two) times daily.    Marland Kitchen triamcinolone cream (KENALOG) 0.1 % Apply 1 application topically 2 (two) times daily. 30 g 0  . TURMERIC PO Take 720 mg by mouth daily.    . vitamin E 100 UNIT capsule Take by mouth daily.    Marland Kitchen albuterol (VENTOLIN HFA) 108 (90 Base) MCG/ACT inhaler Inhale 2 puffs into the lungs every 6 (six) hours as needed for wheezing or shortness of breath. (Patient not  taking: Reported on 03/28/2020) 8 g 0  . budesonide-formoterol (SYMBICORT) 80-4.5 MCG/ACT inhaler Inhale 1 puff into the lungs daily. (Patient not taking: Reported on 03/28/2020) 10.2 g 0  . Spacer/Aero-Holding Chambers DEVI 1 Device by Does not apply route in the morning and at bedtime. (Patient not taking: Reported on 03/28/2020) 1 Device 0   No current facility-administered medications for this visit.    Allergies  Allergen Reactions  . Acetazolamide Other (See Comments)    Blurred vision, Headache, fatigue, loss of appetite  . Benadryl [Diphenhydramine Hcl]     hyperactive  . Codeine   . Neurontin [Gabapentin]     unknown  . Penicillins   . Sulfa Antibiotics   . Tetracyclines & Related   . Decongestant [Oxymetazoline]     Unsure on reaction, was told to never take again     Family History  Problem Relation Age of Onset  . Arthritis Mother   . Breast cancer Mother        in 51's  . Stroke Mother   .  Hypertension Mother   . Hypertension Father   . Diabetes Daughter   . Diabetes Son   . Arthritis Maternal Grandmother   . Arthritis Maternal Grandfather   . Colon cancer Neg Hx   . Esophageal cancer Neg Hx   . Inflammatory bowel disease Neg Hx   . Liver disease Neg Hx   . Pancreatic cancer Neg Hx   . Rectal cancer Neg Hx   . Stomach cancer Neg Hx     Social History   Socioeconomic History  . Marital status: Divorced    Spouse name: Not on file  . Number of children: 3  . Years of education: Not on file  . Highest education level: Not on file  Occupational History  . Occupation: retired  Tobacco Use  . Smoking status: Former Games developer  . Smokeless tobacco: Never Used  . Tobacco comment: quit 25 years ago  Vaping Use  . Vaping Use: Never used  Substance and Sexual Activity  . Alcohol use: Yes    Alcohol/week: 7.0 standard drinks    Types: 7 Glasses of wine per week    Comment: with dinner  . Drug use: No  . Sexual activity: Not Currently  Other Topics Concern  . Not on file  Social History Narrative  . Not on file   Social Determinants of Health   Financial Resource Strain:   . Difficulty of Paying Living Expenses: Not on file  Food Insecurity:   . Worried About Programme researcher, broadcasting/film/video in the Last Year: Not on file  . Ran Out of Food in the Last Year: Not on file  Transportation Needs:   . Lack of Transportation (Medical): Not on file  . Lack of Transportation (Non-Medical): Not on file  Physical Activity:   . Days of Exercise per Week: Not on file  . Minutes of Exercise per Session: Not on file  Stress:   . Feeling of Stress : Not on file  Social Connections:   . Frequency of Communication with Friends and Family: Not on file  . Frequency of Social Gatherings with Friends and Family: Not on file  . Attends Religious Services: Not on file  . Active Member of Clubs or Organizations: Not on file  . Attends Banker Meetings: Not on file  . Marital  Status: Not on file  Intimate Partner Violence:   . Fear of Current or Ex-Partner: Not on  file  . Emotionally Abused: Not on file  . Physically Abused: Not on file  . Sexually Abused: Not on file     Constitutional: Denies fever, malaise, fatigue, headache or abrupt weight changes.  Respiratory: Denies difficulty breathing, shortness of breath, cough or sputum production.   Cardiovascular: Denies chest pain, chest tightness, palpitations or swelling in the hands or feet.  Gastrointestinal: Denies abdominal pain, bloating, constipation, diarrhea or blood in the stool.  GU: Denies urgency, frequency, pain with urination, burning sensation, blood in urine, odor or discharge. Musculoskeletal: Pt reports pubic bone pain, left knee pain. Denies decrease in range of motion, difficulty with gait, muscle pain or joint swelling.  Skin: Pt reports bruising of left knee. Denies redness, rashes, lesions or ulcercations.  Neurological: Denies numbness, tingling, weakness or problems with balance and coordination.    No other specific complaints in a complete review of systems (except as listed in HPI above).     Objective:   Physical Exam   BP 120/60 (BP Location: Left Arm, Patient Position: Sitting, Cuff Size: Normal)   Pulse 70   Temp (!) 97.5 F (36.4 C) (Temporal)   Resp 16   Wt 126 lb 1.9 oz (57.2 kg)   SpO2 96%   BMI 25.05 kg/m  Wt Readings from Last 3 Encounters:  03/28/20 126 lb 1.9 oz (57.2 kg)  01/06/20 129 lb (58.5 kg)  12/02/19 129 lb (58.5 kg)    General: Appears her stated age, in NAD. Skin: Warm, dry and intact. Yellow/green discoloration noted over left knee. Cardiovascular: Normal rate. Pulmonary/Chest: Normal effort and positive vesicular breath sounds. No respiratory distress. No wheezes, rales or ronchi noted.  Abdomen: Soft and Pain with palpation directly over the pubic symphisus. Normal flexion and extension of the left knee. Swelling noted over the fibular head.  No difficulty with gait.  Neurological: Alert and oriented.    BMET    Component Value Date/Time   NA 136 06/07/2019 1255   K 4.3 06/07/2019 1255   CL 96 06/07/2019 1255   CO2 31 06/07/2019 1255   GLUCOSE 92 06/07/2019 1255   BUN 16 06/07/2019 1255   CREATININE 0.75 06/07/2019 1255   CALCIUM 9.9 06/07/2019 1255    Lipid Panel     Component Value Date/Time   CHOL 259 (H) 06/07/2019 1255   TRIG 72.0 06/07/2019 1255   HDL 115.30 06/07/2019 1255   CHOLHDL 2 06/07/2019 1255   VLDL 14.4 06/07/2019 1255   LDLCALC 130 (H) 06/07/2019 1255    CBC    Component Value Date/Time   WBC 5.6 06/07/2019 1255   RBC 4.59 06/07/2019 1255   HGB 15.6 (H) 06/07/2019 1255   HCT 46.8 (H) 06/07/2019 1255   PLT 208.0 06/07/2019 1255   MCV 102.0 (H) 06/07/2019 1255   MCHC 33.3 06/07/2019 1255   RDW 12.8 06/07/2019 1255    Hgb A1C No results found for: HGBA1C         Assessment & Plan:   Pubic Bone Pain:  Xray pelvis today  Left Knee Pain:  Xray left knee today  Will follow up after xrays, return precautions discussed Nicki Reaper, NP This visit occurred during the SARS-CoV-2 public health emergency.  Safety protocols were in place, including screening questions prior to the visit, additional usage of staff PPE, and extensive cleaning of exam room while observing appropriate contact time as indicated for disinfecting solutions.

## 2020-03-29 ENCOUNTER — Other Ambulatory Visit: Payer: Self-pay | Admitting: Internal Medicine

## 2020-04-11 DIAGNOSIS — Z23 Encounter for immunization: Secondary | ICD-10-CM | POA: Diagnosis not present

## 2020-04-17 DIAGNOSIS — H401113 Primary open-angle glaucoma, right eye, severe stage: Secondary | ICD-10-CM | POA: Diagnosis not present

## 2020-04-17 DIAGNOSIS — H401122 Primary open-angle glaucoma, left eye, moderate stage: Secondary | ICD-10-CM | POA: Diagnosis not present

## 2020-05-16 ENCOUNTER — Other Ambulatory Visit: Payer: Self-pay

## 2020-05-16 ENCOUNTER — Ambulatory Visit (INDEPENDENT_AMBULATORY_CARE_PROVIDER_SITE_OTHER): Payer: Medicare Other | Admitting: Internal Medicine

## 2020-05-16 ENCOUNTER — Encounter: Payer: Self-pay | Admitting: Internal Medicine

## 2020-05-16 VITALS — BP 150/60 | HR 74 | Temp 98.5°F | Ht 61.0 in | Wt 126.8 lb

## 2020-05-16 DIAGNOSIS — R0602 Shortness of breath: Secondary | ICD-10-CM

## 2020-05-16 DIAGNOSIS — R0982 Postnasal drip: Secondary | ICD-10-CM

## 2020-05-16 DIAGNOSIS — R067 Sneezing: Secondary | ICD-10-CM

## 2020-05-16 DIAGNOSIS — J3089 Other allergic rhinitis: Secondary | ICD-10-CM | POA: Diagnosis not present

## 2020-05-16 DIAGNOSIS — D751 Secondary polycythemia: Secondary | ICD-10-CM

## 2020-05-16 DIAGNOSIS — R059 Cough, unspecified: Secondary | ICD-10-CM | POA: Diagnosis not present

## 2020-05-16 DIAGNOSIS — R0789 Other chest pain: Secondary | ICD-10-CM

## 2020-05-16 LAB — IBC PANEL
Iron: 112 ug/dL (ref 42–145)
Saturation Ratios: 27.1 % (ref 20.0–50.0)
Transferrin: 295 mg/dL (ref 212.0–360.0)

## 2020-05-16 LAB — CBC
HCT: 47.3 % — ABNORMAL HIGH (ref 36.0–46.0)
Hemoglobin: 15.9 g/dL — ABNORMAL HIGH (ref 12.0–15.0)
MCHC: 33.5 g/dL (ref 30.0–36.0)
MCV: 100.7 fl — ABNORMAL HIGH (ref 78.0–100.0)
Platelets: 218 10*3/uL (ref 150.0–400.0)
RBC: 4.7 Mil/uL (ref 3.87–5.11)
RDW: 13.2 % (ref 11.5–15.5)
WBC: 6.8 10*3/uL (ref 4.0–10.5)

## 2020-05-16 NOTE — Patient Instructions (Signed)
Allergic Rhinitis, Adult Allergic rhinitis is a reaction to allergens in the air. Allergens are tiny specks (particles) in the air that cause your body to have an allergic reaction. This condition cannot be passed from person to person (is not contagious). Allergic rhinitis cannot be cured, but it can be controlled. There are two types of allergic rhinitis:  Seasonal. This type is also called hay fever. It happens only during certain times of the year.  Perennial. This type can happen at any time of the year. What are the causes? This condition may be caused by:  Pollen from grasses, trees, and weeds.  House dust mites.  Pet dander.  Mold. What are the signs or symptoms? Symptoms of this condition include:  Sneezing.  Runny or stuffy nose (nasal congestion).  A lot of mucus in the back of the throat (postnasal drip).  Itchy nose.  Tearing of the eyes.  Trouble sleeping.  Being sleepy during day. How is this treated? There is no cure for this condition. You should avoid things that trigger your symptoms (allergens). Treatment can help to relieve symptoms. This may include:  Medicines that block allergy symptoms, such as antihistamines. These may be given as a shot, nasal spray, or pill.  Shots that are given until your body becomes less sensitive to the allergen (desensitization).  Stronger medicines, if all other treatments have not worked. Follow these instructions at home: Avoiding allergens   Find out what you are allergic to. Common allergens include smoke, dust, and pollen.  Avoid them if you can. These are some of the things that you can do to avoid allergens: ? Replace carpet with wood, tile, or vinyl flooring. Carpet can trap dander and dust. ? Clean any mold found in the home. ? Do not smoke. Do not allow smoking in your home. ? Change your heating and air conditioning filter at least once a month. ? During allergy season:  Keep windows closed as much as  you can. If possible, use air conditioning when there is a lot of pollen in the air.  Use a special filter for allergies with your furnace and air conditioner.  Plan outdoor activities when pollen counts are lowest. This is usually during the early morning or evening hours.  If you do go outdoors when pollen count is high, wear a special mask for people with allergies.  When you come indoors, take a shower and change your clothes before sitting on furniture or bedding. General instructions  Do not use fans in your home.  Do not hang clothes outside to dry.  Wear sunglasses to keep pollen out of your eyes.  Wash your hands right away after you touch household pets.  Take over-the-counter and prescription medicines only as told by your doctor.  Keep all follow-up visits as told by your doctor. This is important. Contact a doctor if:  You have a fever.  You have a cough that does not go away (is persistent).  You start to make whistling sounds when you breathe (wheeze).  Your symptoms do not get better with treatment.  You have thick fluid coming from your nose.  You start to have nosebleeds. Get help right away if:  Your tongue or your lips are swollen.  You have trouble breathing.  You feel dizzy or you feel like you are going to pass out (faint).  You have cold sweats. Summary  Allergic rhinitis is a reaction to allergens in the air.  This condition may be   caused by allergens. These include pollen, dust mites, pet dander, and mold.  Symptoms include a runny, itchy nose, sneezing, or tearing eyes. You may also have trouble sleeping or feel sleepy during the day.  Treatment includes taking medicines and avoiding allergens. You may also get shots or take stronger medicines.  Get help if you have a fever or a cough that does not stop. Get help right away if you are short of breath. This information is not intended to replace advice given to you by your health care  provider. Make sure you discuss any questions you have with your health care provider. Document Revised: 08/25/2018 Document Reviewed: 11/25/2017 [employer] Patient Education  2020 [employer]  

## 2020-05-16 NOTE — Progress Notes (Addendum)
Subjective:    Patient ID: Kara Green, female    DOB: 01-20-1941, 79 y.o.   MRN: [id number]  HPI  Patient presents to the clinic today with complaint of sneezing, postnasal drip, cough and shortness of breath.  She reports this started 2 days ago.  The cough is dry and nonproductive.  The shortness of breath comes and goes and is not worse with exertion.  She denies headache, dizziness, visual changes, runny nose, nasal congestion, ear pain, sore throat, loss of taste or smell.  She denies fever, chills or body aches.  She has really bad allergies, managed on Claritin.  She could not tolerate the addition of Zyrtec at bedtime.  She is unable to use Flonase secondary to glaucoma.  She has not tried any medications OTC for this.  She has not had sick contacts or exposure to Covid that she is aware of.  She has had her Covid vaccines.  She is concerned that her shortness of breath may be due to iron overload and she would like this checked today.  Review of Systems  Past Medical History:  Diagnosis Date  . Allergy   . Arthritis   . Cataract   . Glaucoma   . Hypertension   . IBS (irritable bowel syndrome)   . Thyroid disease   . UTI (urinary tract infection)     Current Outpatient Medications  Medication Sig Dispense Refill  . Acetaminophen (TYLENOL ARTHRITIS PAIN PO) Take by mouth daily.    Marland Kitchen amLODipine (NORVASC) 10 MG tablet TAKE 1 TABLET BY MOUTH EVERY DAY 90 tablet 0  . APPLE CIDER VINEGAR PO Take by mouth.    . Ascorbic Acid (VITAMIN C) 100 MG tablet Take 100 mg by mouth daily.    Marland Kitchen aspirin EC 81 MG tablet Take 81 mg by mouth daily.    . budesonide-formoterol (SYMBICORT) 80-4.5 MCG/ACT inhaler Inhale 1 puff into the lungs daily. 10.2 g 0  . Cholecalciferol (VITAMIN D) 2000 units CAPS Take 1 capsule by mouth daily.    Marland Kitchen CINNAMON PO Take by mouth.    . Coconut Oil 1000 MG CAPS Take by mouth daily.    . Collagen 500 MG CAPS Take by mouth daily.    . Garlic 10 MG CAPS Take  by mouth daily.    Marland Kitchen glucosamine-chondroitin 500-400 MG tablet Take 1 tablet by mouth daily.    Marland Kitchen latanoprost (XALATAN) 0.005 % ophthalmic solution Place 1 drop into both eyes at bedtime.    Marland Kitchen levothyroxine (SYNTHROID) 50 MCG tablet Take 1 tablet (50 mcg total) by mouth daily. 90 tablet 1  . loratadine (KLS ALLERCLEAR) 10 MG tablet Take 10 mg by mouth daily.     . Magnesium 400 MG CAPS Take by mouth daily.    . naproxen sodium (ALEVE) 220 MG tablet Take 220 mg by mouth daily as needed (on rare occasion as needed for pain).    . Omega-3 Fatty Acids (FISH OIL) 1200 MG CAPS Take by mouth daily.    . Prenatal Vit-Fe Fumarate-FA (PRENATAL MULTIVITAMIN) TABS tablet Take 1 tablet by mouth daily at 12 noon.    Marland Kitchen spironolactone (ALDACTONE) 25 MG tablet TAKE 2 TABLETS BY MOUTH EVERY DAY 180 tablet 0  . timolol (BETIMOL) 0.25 % ophthalmic solution Place 1-2 drops into both eyes 2 (two) times daily.    Marland Kitchen triamcinolone cream (KENALOG) 0.1 % Apply 1 application topically 2 (two) times daily. 30 g 0  . TURMERIC PO Take 720 mg  by mouth daily.    . vitamin E 100 UNIT capsule Take by mouth daily.     No current facility-administered medications for this visit.    Allergies  Allergen Reactions  . Acetazolamide Other (See Comments)    Blurred vision, Headache, fatigue, loss of appetite  . Benadryl [Diphenhydramine Hcl]     hyperactive  . Codeine   . Neurontin [Gabapentin]     unknown  . Penicillins   . Sulfa Antibiotics   . Tetracyclines & Related   . Decongestant [Oxymetazoline]     Unsure on reaction, was told to never take again     Family History  Problem Relation Age of Onset  . Arthritis Mother   . Breast cancer Mother        in 35's  . Stroke Mother   . Hypertension Mother   . Hypertension Father   . Diabetes Daughter   . Diabetes Son   . Arthritis Maternal Grandmother   . Arthritis Maternal Grandfather   . Colon cancer Neg Hx   . Esophageal cancer Neg Hx   . Inflammatory bowel  disease Neg Hx   . Liver disease Neg Hx   . Pancreatic cancer Neg Hx   . Rectal cancer Neg Hx   . Stomach cancer Neg Hx     Social History   Socioeconomic History  . Marital status: Divorced    Spouse name: Not on file  . Number of children: 3  . Years of education: Not on file  . Highest education level: Not on file  Occupational History  . Occupation: retired  Tobacco Use  . Smoking status: Former Games developer  . Smokeless tobacco: Never Used  . Tobacco comment: quit 25 years ago  Vaping Use  . Vaping Use: Never used  Substance and Sexual Activity  . Alcohol use: Yes    Alcohol/week: 7.0 standard drinks    Types: 7 Glasses of wine per week    Comment: with dinner  . Drug use: No  . Sexual activity: Not Currently  Other Topics Concern  . Not on file  Social History Narrative  . Not on file   Social Determinants of Health   Financial Resource Strain: Not on file  Food Insecurity: Not on file  Transportation Needs: Not on file  Physical Activity: Not on file  Stress: Not on file  Social Connections: Not on file  Intimate Partner Violence: Not on file     Constitutional: Denies fever, malaise, fatigue, headache or abrupt weight changes.  HEENT: Patient reports sneezing, postnasal drip.  Denies eye pain, eye redness, ear pain, ringing in the ears, wax buildup, runny nose, nasal congestion, bloody nose, or sore throat. Respiratory: Patient reports cough and shortness of breath.  Denies difficulty breathing, or sputum production.   Cardiovascular: Denies chest pain, chest tightness, palpitations or swelling in the hands or feet.   No other specific complaints in a complete review of systems (except as listed in HPI above).     Objective:   Physical Exam   BP (!) 150/60 (BP Location: Right Arm, Patient Position: Sitting, Cuff Size: Normal)   Pulse 74   Temp 98.5 F (36.9 C) (Temporal)   Ht 5\' 1"  (1.549 m)   Wt 126 lb 12.8 oz (57.5 kg)   SpO2 91%   BMI 23.96 kg/m   Wt Readings from Last 3 Encounters:  05/16/20 126 lb 12.8 oz (57.5 kg)  03/28/20 126 lb 1.9 oz (57.2 kg)  01/06/20 129  lb (58.5 kg)    General: Appears her stated age, well developed, well nourished in NAD. HEENT: Head: normal shape and size, no sinus pressure noted.; Eyes: sclera white, no icterus, conjunctiva pink, PERRLA and EOMs intact; Ears: Tm's gray and intact, normal light reflex; Nose: mucosa pink and moist, septum midline; Throat/Mouth: Teeth present, mucosa pink and moist, + PND, no exudate, lesions or ulcerations noted.  Neck:  No adenopathy noted Cardiovascular: Normal rate and rhythm. S1,S2 noted.  No murmur, rubs or gallops noted. Pulmonary/Chest: Normal effort and positive vesicular breath sounds. No respiratory distress. No wheezes, rales or ronchi noted.  Neurological: Alert and oriented.    BMET    Component Value Date/Time   NA 136 06/07/2019 1255   K 4.3 06/07/2019 1255   CL 96 06/07/2019 1255   CO2 31 06/07/2019 1255   GLUCOSE 92 06/07/2019 1255   BUN 16 06/07/2019 1255   CREATININE 0.75 06/07/2019 1255   CALCIUM 9.9 06/07/2019 1255    Lipid Panel     Component Value Date/Time   CHOL 259 (H) 06/07/2019 1255   TRIG 72.0 06/07/2019 1255   HDL 115.30 06/07/2019 1255   CHOLHDL 2 06/07/2019 1255   VLDL 14.4 06/07/2019 1255   LDLCALC 130 (H) 06/07/2019 1255    CBC    Component Value Date/Time   WBC 5.6 06/07/2019 1255   RBC 4.59 06/07/2019 1255   HGB 15.6 (H) 06/07/2019 1255   HCT 46.8 (H) 06/07/2019 1255   PLT 208.0 06/07/2019 1255   MCV 102.0 (H) 06/07/2019 1255   MCHC 33.3 06/07/2019 1255   RDW 12.8 06/07/2019 1255    Hgb A1C No results found for: HGBA1C         Assessment & Plan:   Sneezing, Postnasal Drip, Cough and Shortness of Breath:  Likely allergies Advised her to switch her Claritin to Zyrtec to see if this is more beneficial Will refer to allergist for further evaluation No indication for oral steroids, antibiotics or  repeat chest x-ray at this time  Polycythemia:  CBC and IBC panel today   Nicki Reaper, NP This visit occurred during the SARS-CoV-2 public health emergency.  Safety protocols were in place, including screening questions prior to the visit, additional usage of staff PPE, and extensive cleaning of exam room while observing appropriate contact time as indicated for disinfecting solutions.    We will follow-up after labs, return precautions discussed

## 2020-06-13 ENCOUNTER — Telehealth: Payer: Self-pay | Admitting: *Deleted

## 2020-06-13 NOTE — Telephone Encounter (Signed)
Pt called scheduler line an asked PCP or assistant to call back regarding labs. Pt hasn't heard anything and it's been almost a month   CB # [phone]

## 2020-06-14 NOTE — Telephone Encounter (Signed)
See result note.  

## 2020-06-19 DIAGNOSIS — H401113 Primary open-angle glaucoma, right eye, severe stage: Secondary | ICD-10-CM | POA: Diagnosis not present

## 2020-06-19 DIAGNOSIS — H401122 Primary open-angle glaucoma, left eye, moderate stage: Secondary | ICD-10-CM | POA: Diagnosis not present

## 2020-06-20 ENCOUNTER — Other Ambulatory Visit: Payer: Self-pay

## 2020-06-20 ENCOUNTER — Encounter: Payer: Self-pay | Admitting: Allergy & Immunology

## 2020-06-20 ENCOUNTER — Ambulatory Visit (INDEPENDENT_AMBULATORY_CARE_PROVIDER_SITE_OTHER): Payer: Medicare HMO | Admitting: Allergy & Immunology

## 2020-06-20 VITALS — BP 146/92 | HR 84 | Temp 97.7°F | Resp 18 | Ht 61.0 in | Wt 131.0 lb

## 2020-06-20 DIAGNOSIS — J449 Chronic obstructive pulmonary disease, unspecified: Secondary | ICD-10-CM | POA: Diagnosis not present

## 2020-06-20 DIAGNOSIS — L2089 Other atopic dermatitis: Secondary | ICD-10-CM | POA: Diagnosis not present

## 2020-06-20 DIAGNOSIS — J3089 Other allergic rhinitis: Secondary | ICD-10-CM | POA: Diagnosis not present

## 2020-06-20 NOTE — Patient Instructions (Addendum)
1. Perennial allergic rhinitis - Testing was only positive to an indoor mold mix. - Copy of testing results provided. - Add on nasal ipratropium one spray per nostril daily (can increase to twice daily on bad days).  2. Asthma-COPD overlap syndrome (HCC) - Lung testing showed a lung value in the mid 50% range. - I would like you to use Symbicort two puffs twice daily. - Spacer sample and demonstration provided. - Daily controller medication(s): Symbicort 80/4.62mcg two puffs twice daily with spacer - Prior to physical activity: albuterol 2 puffs 10-15 minutes before physical activity. - Rescue medications: albuterol 4 puffs every 4-6 hours as needed - Asthma control goals:  * Full participation in all desired activities (may need albuterol before activity) * Albuterol use two time or less a week on average (not counting use with activity) * Cough interfering with sleep two time or less a month * Oral steroids no more than once a year * No hospitalizations  3. Return in about 3 months (around 09/17/2020).    Please inform us of any Emergency Department visits, hospitalizations, or changes in symptoms. Call us before going to the ED for breathing or allergy symptoms since we might be able to fit you in for a sick visit. Feel free to contact us anytime with any questions, problems, or concerns.  It was a pleasure to meet you today!  Websites that have reliable patient information: 1. American Academy of Asthma, Allergy, and Immunology: www.aaaai.org 2. Food Allergy Research and Education (FARE): foodallergy.org 3. Mothers of Asthmatics: http://www.asthmacommunitynetwork.org 4. American College of Allergy, Asthma, and Immunology: www.acaai.org   COVID-19 Vaccine Information can be found at: PodExchange.nl For questions related to vaccine distribution or appointments, please email [email or web address] or call [phone].      "Like" [place] on Facebook and Instagram for our latest updates!       Make sure you are registered to vote! If you have moved or changed any of your contact information, you will need to get this updated before voting!  In some cases, you MAY be able to register to vote online: [email or web address]    Control of Mold Allergen   Mold and fungi can grow on a variety of surfaces provided certain temperature and moisture conditions exist.  Outdoor molds grow on plants, decaying vegetation and soil.  The major outdoor mold, Alternaria and Cladosporium, are found in very high numbers during hot and dry conditions.  Generally, a late Summer - Fall peak is seen for common outdoor fungal spores.  Rain will temporarily lower outdoor mold spore count, but counts rise rapidly when the rainy period ends.  The most important indoor molds are Aspergillus and Penicillium.  Dark, humid and poorly ventilated basements are ideal sites for mold growth.  The next most common sites of mold growth are the bathroom and the kitchen.  Indoor (Perennial) Mold Control   Positive indoor molds via skin testing: Aspergillus and Penicillium  1. Maintain humidity below 50%. 2. Clean washable surfaces with 5% bleach solution. 3. Remove sources e.g. contaminated carpets.

## 2020-06-20 NOTE — Progress Notes (Signed)
NEW PATIENT  Date of Service/Encounter:  06/20/20  Referring provider: Lorre Munroe, NP   Assessment:   Perennial allergic rhinitis  Asthma-COPD overlap syndrome - GOLD A   Eczema - controlled with triamcinolone as needed  Plan/Recommendations:   1. Perennial allergic rhinitis - Testing was only positive to an indoor mold mix. - Copy of testing results provided. - Add on nasal ipratropium one spray per nostril daily (can increase to twice daily on bad days).  2. Asthma-COPD overlap syndrome - Lung testing showed a lung value in the mid 50% range. - I would like you to use Symbicort two puffs twice daily. - Spacer sample and demonstration provided. - Daily controller medication(s): Symbicort 80/4.29mcg two puffs twice daily with spacer - Prior to physical activity: albuterol 2 puffs 10-15 minutes before physical activity. - Rescue medications: albuterol 4 puffs every 4-6 hours as needed - Asthma control goals:  * Full participation in all desired activities (may need albuterol before activity) * Albuterol use two time or less a week on average (not counting use with activity) * Cough interfering with sleep two time or less a month * Oral steroids no more than once a year * No hospitalizations  3. Return in about 3 months (around 09/17/2020).    Subjective:   Kara Green is a 80 y.o. female presenting today for evaluation of  Chief Complaint  Patient presents with  . Allergic Rhinitis   . Shortness of Breath    Kara Green has a history of the following: Patient Active Problem List   Diagnosis Date Noted  . Pulmonary emphysema (HCC) 12/02/2019  . IBS (irritable bowel syndrome) 06/07/2019  . Osteoarthritis 01/01/2017  . HTN (hypertension) 01/01/2017  . Hypothyroidism 01/01/2017     History obtained from: chart review and patient.   Kara Green was referred by Lorre Munroe, NP.     Kara Green is a 80 y.o. female presenting for an  evaluation of possible environmental allergies.   Asthma/Respiratory Symptom History: She does not have a history of diagnosed asthma or COPD. She apparently had bad breathing issues at some point and was placed on albuterol. The following day, she was getting notified by her pharmacy that she had a lot of new prescriptions. She was confused and called her PCP and the triage nurse said that she had emphysema. She has Symbicort prescribed and never took the albuterol. She was placed on prednisone at the same time. She finished one month of Symbicort and she got another one. She was told to quit taking it when she was better. The Symbicort is not something she had been on forever. She was a smoker for around 19 years. She quit in 1994.  Allergic Rhinitis Symptom History: She reports that she has year long PND. She has sinus issues as well. She has a lot of drainage down her throat. She is currently on Zyrtec at night and Claritin in the morning. She was changed to Zyrtec only, which she has been on for around two months ago. This is clearly not working. She is not on Flonase since it raised her pressure.   Eczema Symptom History: She does have a history of eczema. She has triamcinolone to use as needed. She does see a Armed forces operational officer for this. She seems the Dermatologist only once since moving here.   She was born in [place] and moved to [place] when she was 6. Her oldest daughter moved here 14 years ago.   Otherwise, there is  no history of other atopic diseases, including food allergies, drug allergies, stinging insect allergies, eczema, urticaria or contact dermatitis. There is no significant infectious history. Vaccinations are up to date.    Past Medical History: Patient Active Problem List   Diagnosis Date Noted  . Pulmonary emphysema (HCC) 12/02/2019  . IBS (irritable bowel syndrome) 06/07/2019  . Osteoarthritis 01/01/2017  . HTN (hypertension) 01/01/2017  . Hypothyroidism 01/01/2017     Medication List:  Allergies as of 06/20/2020      Reactions   Acetazolamide Other (See Comments)   Blurred vision, Headache, fatigue, loss of appetite   Benadryl [diphenhydramine Hcl]    hyperactive   Codeine    Neurontin [gabapentin]    unknown   Penicillins    Sulfa Antibiotics    Tetracyclines & Related    Decongestant [oxymetazoline]    Unsure on reaction, was told to never take again      Medication List       Accurate as of June 20, 2020  3:50 PM. If you have any questions, ask your nurse or doctor.        amLODipine 10 MG tablet Commonly known as: NORVASC TAKE 1 TABLET BY MOUTH EVERY DAY   APPLE CIDER VINEGAR PO Take by mouth.   apraclonidine 0.5 % ophthalmic solution Commonly known as: IOPIDINE Place 1 drop into both eyes 2 (two) times daily.   aspirin EC 81 MG tablet Take 81 mg by mouth daily.   budesonide-formoterol 80-4.5 MCG/ACT inhaler Commonly known as: SYMBICORT Inhale 1 puff into the lungs daily.   CINNAMON PO Take by mouth.   Coconut Oil 1000 MG Caps Take by mouth daily.   Collagen 500 MG Caps Take by mouth daily.   Fish Oil 1200 MG Caps Take by mouth daily.   Garlic 10 MG Caps Take by mouth daily.   glucosamine-chondroitin 500-400 MG tablet Take 1 tablet by mouth daily.   KLS AllerClear 10 MG tablet Generic drug: loratadine Take 10 mg by mouth daily.   latanoprost 0.005 % ophthalmic solution Commonly known as: XALATAN Place 1 drop into both eyes at bedtime.   levothyroxine 50 MCG tablet Commonly known as: SYNTHROID Take 1 tablet (50 mcg total) by mouth daily.   Magnesium 400 MG Caps Take by mouth daily.   naproxen sodium 220 MG tablet Commonly known as: ALEVE Take 220 mg by mouth daily as needed (on rare occasion as needed for pain).   prenatal multivitamin Tabs tablet Take 1 tablet by mouth daily at 12 noon.   spironolactone 25 MG tablet Commonly known as: ALDACTONE TAKE 2 TABLETS BY MOUTH EVERY DAY    timolol 0.25 % ophthalmic solution Commonly known as: BETIMOL Place 1-2 drops into both eyes 2 (two) times daily.   timolol 0.5 % ophthalmic solution Commonly known as: TIMOPTIC SMARTSIG:In Eye(s)   triamcinolone 0.1 % Commonly known as: KENALOG Apply 1 application topically 2 (two) times daily.   TURMERIC PO Take 720 mg by mouth daily.   TYLENOL ARTHRITIS PAIN PO Take by mouth daily.   vitamin C 100 MG tablet Take 100 mg by mouth daily.   Vitamin D 50 MCG (2000 UT) Caps Take 1 capsule by mouth daily.   vitamin E 45 MG (100 UNITS) capsule Take by mouth daily.       Birth History: non-contributory  Developmental History: non-contributory  Past Surgical History: Past Surgical History:  Procedure Laterality Date  . ABDOMINAL HYSTERECTOMY  1981   total  .  APPENDECTOMY  1981  . cataracts Bilateral   . HERNIA REPAIR  1980  . TONSILLECTOMY AND ADENOIDECTOMY       Family History: Family History  Problem Relation Age of Onset  . Arthritis Mother   . Breast cancer Mother        in 91's  . Stroke Mother   . Hypertension Mother   . Hypertension Father   . Diabetes Daughter   . Diabetes Son   . Arthritis Maternal Grandmother   . Arthritis Maternal Grandfather   . Colon cancer Neg Hx   . Esophageal cancer Neg Hx   . Inflammatory bowel disease Neg Hx   . Liver disease Neg Hx   . Pancreatic cancer Neg Hx   . Rectal cancer Neg Hx   . Stomach cancer Neg Hx      Social History: Kara Green lives at home in a house that is 82 years old.  There is hardwood throughout the home.  She has electric heating and central cooling.  There are no indoor animals.  There are no dust mite covers on the bedding.  There is no tobacco exposure.  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  She does not use a HEPA filter in the home.  She does not live near an interstate or industrial area.   Review  of Systems  Constitutional: Negative.  Negative for chills, fever, malaise/fatigue and weight loss.  HENT: Positive for congestion. Negative for ear discharge and ear pain.        Positive for postnasal drip.  Positive for sneezing.  Eyes: Negative for pain, discharge and redness.  Respiratory: Positive for cough. Negative for sputum production, shortness of breath and wheezing.   Cardiovascular: Negative.  Negative for chest pain and palpitations.  Gastrointestinal: Negative for abdominal pain, heartburn, nausea and vomiting.  Skin: Negative.  Negative for itching and rash.  Neurological: Negative for dizziness and headaches.  Endo/Heme/Allergies: Negative for environmental allergies. Does not bruise/bleed easily.       Objective:   Blood pressure (!) 146/92, pulse 84, temperature 97.7 F (36.5 C), temperature source Temporal, resp. rate 18, height 5\' 1"  (1.549 m), weight 131 lb (59.4 kg), SpO2 93 %. Body mass index is 24.75 kg/m.   Physical Exam:   Physical Exam Constitutional:      Appearance: She is well-developed.     Comments: Very talkative.  Cooperative with the exam.  HENT:     Head: Normocephalic and atraumatic.     Right Ear: Tympanic membrane, ear canal and external ear normal. No drainage, swelling or tenderness. Tympanic membrane is not injected, scarred, erythematous, retracted or bulging.     Left Ear: Tympanic membrane, ear canal and external ear normal. No drainage, swelling or tenderness. Tympanic membrane is not injected, scarred, erythematous, retracted or bulging.     Nose: No nasal deformity, septal deviation, mucosal edema, rhinorrhea or epistaxis.     Right Turbinates: Enlarged and swollen.     Left Turbinates: Enlarged and swollen.     Right Sinus: No maxillary sinus tenderness or frontal sinus tenderness.     Left Sinus: No maxillary sinus tenderness or frontal sinus tenderness.     Mouth/Throat:     Mouth: Oropharynx is clear and moist. Mucous  membranes are not pale and not dry.     Pharynx: Uvula midline.     Comments:  Moderate cobblestoning present. Eyes:     General:        Right eye: No discharge.        Left eye: No discharge.     Extraocular Movements: EOM normal.     Conjunctiva/sclera: Conjunctivae normal.     Right eye: Right conjunctiva is not injected. No chemosis.    Left eye: Left conjunctiva is not injected. No chemosis.    Pupils: Pupils are equal, round, and reactive to light.  Cardiovascular:     Rate and Rhythm: Normal rate and regular rhythm.     Heart sounds: Normal heart sounds.  Pulmonary:     Effort: Pulmonary effort is normal. No tachypnea, accessory muscle usage or respiratory distress.     Breath sounds: Normal breath sounds. No wheezing, rhonchi or rales.     Comments: Somewhat decreased air movement at the bases, but overall her lungs sound fairly good. Chest:     Chest wall: No tenderness.  Abdominal:     Tenderness: There is no abdominal tenderness. There is no guarding or rebound.  Lymphadenopathy:     Head:     Right side of head: No submandibular, tonsillar or occipital adenopathy.     Left side of head: No submandibular, tonsillar or occipital adenopathy.     Cervical: No cervical adenopathy.  Skin:    General: Skin is warm.     Capillary Refill: Capillary refill takes 2 to 3 seconds.     Coloration: Skin is not pale.     Findings: No abrasion, erythema, petechiae or rash. Rash is not papular, urticarial or vesicular.     Comments: Thin skinned.  Neurological:     Mental Status: She is alert.  Psychiatric:        Mood and Affect: Mood and affect normal.      Diagnostic studies:    Spirometry: results abnormal (FEV1: 0.90/55%, FVC: 1.62/73%, FEV1/FVC: 56%).    Spirometry consistent with moderate obstructive disease. Xopenex four puffs via MDI treatment given in clinic with no improvement.   Allergy Studies:     Airborne Adult Perc - 06/20/20 1446    Time Antigen Placed 1446     Allergen Manufacturer Waynette Buttery    Location Back    Number of Test 59    Panel 1 Select    1. Control-Buffer 50% Glycerol Negative    2. Control-Histamine 1 mg/ml 2+    3. Albumin saline Negative    4. Bahia Negative    5. French Southern Territories Negative    6. Johnson Negative    7. Kentucky Blue Negative    8. Meadow Fescue Negative    9. Perennial Rye Negative    10. Sweet Vernal Negative    11. Timothy Negative    12. Cocklebur Negative    13. Burweed Marshelder Negative    14. Ragweed, short Negative    15. Ragweed, Giant Negative    16. Plantain,  English Negative    17. Lamb's Quarters Negative    18. Sheep Sorrell Negative    19. Rough Pigweed Negative    20. Marsh Elder, Rough Negative    21. Mugwort, Common Negative    22. Ash mix Negative    23. Birch mix Negative    24. Beech American Negative    25. Box, Elder Negative    26. Cedar, red Negative    27. Cottonwood, Guinea-Bissau Negative    28. Elm mix Negative    29. Hickory Negative  30. Maple mix Negative    31. Oak, Guinea-[place]issau mix Negative    32. Pecan Pollen Negative    33. Pine mix Negative    34. Sycamore Eastern Negative    35. Walnut, Black Pollen Negative    36. Alternaria alternata Negative    37. Cladosporium Herbarum Negative    38. Aspergillus mix Negative    39. Penicillium mix Negative    40. Bipolaris sorokiniana (Helminthosporium) Negative    41. Drechslera spicifera (Curvularia) Negative    42. Mucor plumbeus Negative    43. Fusarium moniliforme Negative    44. Aureobasidium pullulans (pullulara) Negative    45. Rhizopus oryzae Negative    46. Botrytis cinera Negative    47. Epicoccum nigrum Negative    48. Phoma betae Negative    49. Candida Albicans Negative    50. Trichophyton mentagrophytes Negative    51. Mite, D Farinae  5,000 AU/ml Negative    52. Mite, D Pteronyssinus  5,000 AU/ml Negative    53. Cat Hair 10,000 BAU/ml Negative    54.  Dog Epithelia Negative    55. Mixed Feathers Negative     56. Horse Epithelia Negative    57. Cockroach, German Negative    58. Mouse Negative    59. Tobacco Leaf Negative          Intradermal - 06/20/20 1526    Time Antigen Placed 1526    Allergen Manufacturer Waynette Buttery    Location Arm    Number of Test 15    Intradermal Select    Control Negative    French Southern Territories Negative    Johnson Negative    7 Grass Negative    Ragweed mix Negative    Weed mix Negative    Tree mix Negative    Mold 1 Negative    Mold 2 3+    Mold 3 Negative    Mold 4 Negative    Cat Negative    Dog Negative    Cockroach Negative    Mite mix Negative           Allergy testing results were read and interpreted by myself, documented by clinical staff.         Malachi Bonds, MD [facility]

## 2020-06-21 ENCOUNTER — Other Ambulatory Visit: Payer: Self-pay | Admitting: Internal Medicine

## 2020-06-21 ENCOUNTER — Encounter: Payer: Self-pay | Admitting: Allergy & Immunology

## 2020-06-21 MED ORDER — IPRATROPIUM BROMIDE 0.06 % NA SOLN: 2.0000 | Freq: Three times a day (TID) | NASAL | 5 refills | Status: DC

## 2020-06-23 DIAGNOSIS — H524 Presbyopia: Secondary | ICD-10-CM | POA: Diagnosis not present

## 2020-06-26 ENCOUNTER — Other Ambulatory Visit: Payer: Self-pay | Admitting: Internal Medicine

## 2020-06-30 ENCOUNTER — Ambulatory Visit: Payer: Medicare Other | Admitting: Allergy

## 2020-07-02 ENCOUNTER — Other Ambulatory Visit: Payer: Self-pay | Admitting: Internal Medicine

## 2020-07-18 ENCOUNTER — Other Ambulatory Visit: Payer: Self-pay | Admitting: Internal Medicine

## 2020-07-31 DIAGNOSIS — H401113 Primary open-angle glaucoma, right eye, severe stage: Secondary | ICD-10-CM | POA: Diagnosis not present

## 2020-07-31 DIAGNOSIS — H401122 Primary open-angle glaucoma, left eye, moderate stage: Secondary | ICD-10-CM | POA: Diagnosis not present

## 2020-09-19 ENCOUNTER — Encounter: Payer: Self-pay | Admitting: Allergy & Immunology

## 2020-09-19 ENCOUNTER — Ambulatory Visit: Payer: Medicare HMO | Admitting: Allergy & Immunology

## 2020-09-19 ENCOUNTER — Other Ambulatory Visit: Payer: Self-pay

## 2020-09-19 VITALS — BP 128/76 | HR 60 | Temp 96.6°F | Resp 16 | Ht 61.0 in | Wt 129.8 lb

## 2020-09-19 DIAGNOSIS — J3089 Other allergic rhinitis: Secondary | ICD-10-CM

## 2020-09-19 DIAGNOSIS — L2089 Other atopic dermatitis: Secondary | ICD-10-CM

## 2020-09-19 DIAGNOSIS — J449 Chronic obstructive pulmonary disease, unspecified: Secondary | ICD-10-CM | POA: Diagnosis not present

## 2020-09-19 NOTE — Patient Instructions (Addendum)
1. Perennial allergic rhinitis (indoor mold mix) - Continue with nasal ipratropium one spray per nostril daily (can increase to twice daily on bad days). - This can be over drying so be careful. - You seem to have a good handle on your symptoms.  2. Asthma-COPD overlap syndrome - We are not going to make any changes at all. - Daily controller medication(s): Symbicort 80/4.32mcg two puffs twice daily with spacer - Prior to physical activity: albuterol 2 puffs 10-15 minutes before physical activity. - Rescue medications: albuterol 4 puffs every 4-6 hours as needed - Asthma control goals:  * Full participation in all desired activities (may need albuterol before activity) * Albuterol use two time or less a week on average (not counting use with activity) * Cough interfering with sleep two time or less a month * Oral steroids no more than once a year * No hospitalizations  3. Return in about 6 months (around 03/22/2021).    Please inform us of any Emergency Department visits, hospitalizations, or changes in symptoms. Call us before going to the ED for breathing or allergy symptoms since we might be able to fit you in for a sick visit. Feel free to contact us anytime with any questions, problems, or concerns.  It was a pleasure to see you again today!  Websites that have reliable patient information: 1. American Academy of Asthma, Allergy, and Immunology: www.aaaai.org 2. Food Allergy Research and Education (FARE): foodallergy.org 3. Mothers of Asthmatics: http://www.asthmacommunitynetwork.org 4. American College of Allergy, Asthma, and Immunology: www.acaai.org   COVID-19 Vaccine Information can be found at: [email or web address].[email or web address] For questions related to vaccine distribution or appointments, please email [email or web address] or call [phone].   We realize that you might be concerned about having an allergic reaction to the  COVID19 vaccines. To help with that concern, WE ARE OFFERING THE COVID19 VACCINES IN OUR OFFICE! Ask the front desk for dates!     "Like" [place] on Facebook and Instagram for our latest updates!      A healthy democracy works best when [employer] participate! Make sure you are registered to vote! If you have moved or changed any of your contact information, you will need to get this updated before voting!  In some cases, you MAY be able to register to vote online: [email or web address]

## 2020-09-19 NOTE — Progress Notes (Signed)
FOLLOW UP  Date of Service/Encounter:  09/21/20   Assessment:   Perennial allergic rhinitis  Asthma-COPD overlap syndrome - GOLD A   Eczema - controlled with triamcinolone as needed  Plan/Recommendations:   1. Perennial allergic rhinitis (indoor mold mix) - Continue with nasal ipratropium one spray per nostril daily (can increase to twice daily on bad days). - This can be over drying so be careful. - You seem to have a good handle on your symptoms.  2. Asthma-COPD overlap syndrome - We are not going to make any changes at all. - Daily controller medication(s): Symbicort 80/4.41mcg two puffs twice daily with spacer - Prior to physical activity: albuterol 2 puffs 10-15 minutes before physical activity. - Rescue medications: albuterol 4 puffs every 4-6 hours as needed - Asthma control goals:  * Full participation in all desired activities (may need albuterol before activity) * Albuterol use two time or less a week on average (not counting use with activity) * Cough interfering with sleep two time or less a month * Oral steroids no more than once a year * No hospitalizations  3. Return in about 6 months (around 03/22/2021).   Subjective:   Kara Green is a 80 y.o. female presenting today for follow up of  Chief Complaint  Patient presents with  . Allergic Rhinitis     Sneezing, coughing - yesterday had a lot of sneezing fits while cooking     Kara Green has a history of the following: Patient Active Problem List   Diagnosis Date Noted  . Pulmonary emphysema (HCC) 12/02/2019  . IBS (irritable bowel syndrome) 06/07/2019  . Osteoarthritis 01/01/2017  . HTN (hypertension) 01/01/2017  . Hypothyroidism 01/01/2017    History obtained from: chart review and patient.  Kara Green is a 80 y.o. female presenting for a follow up visit.  She was last seen in February 20 she was an inpatient.  At that time, she had testing that was positive only to 1 indoor mold  mix.  We gave her copy of the testing results.  We added on nasal ipratropium 1 spray per nostril daily, increasing to twice daily on bad days.  Her lung testing showed values in the mid 50% range.  We started her on Symbicort 80/4.5 mcg 2 puffs twice daily with a spacer.  Since last visit, she has done well.   Asthma/Respiratory Symptom History: She has done fairly well for her breathing. She is taking thre Symbicort two puffs twice daily. She reports compliance with her medication regimen. Doxie's asthma has been well controlled. She has not required rescue medication, experienced nocturnal awakenings due to lower respiratory symptoms, nor have activities of daily living been limited. She has required no Emergency Department or Urgent Care visits for her asthma. She has required zero courses of systemic steroids for asthma exacerbations since the last visit. ACT score today is 25, indicating excellent asthma symptom control.   Allergic Rhinitis Symptom History: She has been using the Atrovent with improvement in rhinorrhea. She does it once in the morning and it lasts for 24 hours. She is very happy with how well she is doing. She has not needed antibiotics at all for sinusitis.   Otherwise, there have been no changes to her past medical history, surgical history, family history, or social history.     Review of Systems  Constitutional: Negative.  Negative for chills, fever, malaise/fatigue and weight loss.  HENT: Negative for congestion, ear discharge, ear pain and sinus pain.  Eyes: Negative for pain, discharge and redness.  Respiratory: Negative for cough, sputum production, shortness of breath and wheezing.   Cardiovascular: Negative.  Negative for chest pain and palpitations.  Gastrointestinal: Negative for abdominal pain, constipation, diarrhea, heartburn, nausea and vomiting.  Skin: Negative.  Negative for itching and rash.  Neurological: Negative for dizziness and headaches.   Endo/Heme/Allergies: Positive for environmental allergies. Does not bruise/bleed easily.       Objective:   Blood pressure 128/76, pulse 60, temperature (!) 96.6 F (35.9 C), resp. rate 16, height 5\' 1"  (1.549 m), weight 129 lb 12.8 oz (58.9 kg), SpO2 95 %. Body mass index is 24.53 kg/m.   Physical Exam:  Physical Exam Constitutional:      Appearance: She is well-developed.     Comments: Very pleasant female. Cooperative with the exam.   HENT:     Head: Normocephalic and atraumatic.     Right Ear: Tympanic membrane, ear canal and external ear normal.     Left Ear: Tympanic membrane, ear canal and external ear normal.     Nose: No nasal deformity, septal deviation, mucosal edema or rhinorrhea.     Right Turbinates: Enlarged and swollen.     Left Turbinates: Enlarged and swollen.     Right Sinus: No maxillary sinus tenderness or frontal sinus tenderness.     Left Sinus: No maxillary sinus tenderness or frontal sinus tenderness.     Comments: Enlarged nasal turbinates noted.     Mouth/Throat:     Mouth: Mucous membranes are not pale and not dry.     Pharynx: Uvula midline.     Comments: Cobblestoning present in the posterior oropharynx.  Eyes:     General:        Right eye: No discharge.        Left eye: No discharge.     Conjunctiva/sclera: Conjunctivae normal.     Right eye: Right conjunctiva is not injected. No chemosis.    Left eye: Left conjunctiva is not injected. No chemosis.    Pupils: Pupils are equal, round, and reactive to light.  Cardiovascular:     Rate and Rhythm: Normal rate and regular rhythm.     Heart sounds: Normal heart sounds.  Pulmonary:     Effort: Pulmonary effort is normal. No tachypnea, accessory muscle usage or respiratory distress.     Breath sounds: Normal breath sounds. No wheezing, rhonchi or rales.     Comments: Moving air well in all lung fields. No increased work of breathing noted.  Chest:     Chest wall: No tenderness.   Lymphadenopathy:     Cervical: No cervical adenopathy.  Skin:    General: Skin is warm.     Capillary Refill: Capillary refill takes less than 2 seconds.     Coloration: Skin is not pale.     Findings: No abrasion, erythema, petechiae or rash. Rash is not papular, urticarial or vesicular.     Comments: No eczematous or urticarial lesions noted.   Neurological:     Mental Status: She is alert.  Psychiatric:        Behavior: Behavior is cooperative.       Diagnostic studies: none       , MD  [facility]

## 2020-10-02 DIAGNOSIS — H401113 Primary open-angle glaucoma, right eye, severe stage: Secondary | ICD-10-CM | POA: Diagnosis not present

## 2020-10-02 DIAGNOSIS — H401122 Primary open-angle glaucoma, left eye, moderate stage: Secondary | ICD-10-CM | POA: Diagnosis not present

## 2020-11-26 ENCOUNTER — Other Ambulatory Visit: Payer: Self-pay | Admitting: Internal Medicine

## 2020-11-28 NOTE — Telephone Encounter (Signed)
Last office visit 05/16/2020 for Cough with Kara Green.  Last refilled 01/06/2020 for 10.2 g with no refills.  No TOC appointments.

## 2020-12-01 ENCOUNTER — Telehealth: Payer: Self-pay

## 2020-12-01 NOTE — Telephone Encounter (Signed)
At this time I am not accepting TOC.

## 2020-12-01 NOTE — Telephone Encounter (Signed)
Patient called asking to transfer to Dr Selena Batten since Nicki Reaper has left. I did advise patient that we have 2 NPs starting but patient wanted a MD. I advised that I would ask and we would call her back with an answer.

## 2020-12-03 ENCOUNTER — Other Ambulatory Visit: Payer: Self-pay | Admitting: Internal Medicine

## 2020-12-04 NOTE — Telephone Encounter (Signed)
Last office visit 05/16/2020 for Allergies with R. Baity.  Last refilled Levothyroxine-07/18/2020 for #90 with no refills.  Last thyroid labs 01/06/2020 with normal TSH at 1.15 uIU/mL.  Amlodipine 07/18/2020 for #90 with no refills.  No TOC appointments.

## 2020-12-05 NOTE — Telephone Encounter (Signed)
Patient advised. Patient will wait for Kara Green to start and will call back to schedule

## 2020-12-07 ENCOUNTER — Other Ambulatory Visit: Payer: Self-pay | Admitting: Internal Medicine

## 2020-12-19 ENCOUNTER — Telehealth: Payer: Self-pay | Admitting: Allergy & Immunology

## 2020-12-19 MED ORDER — BUDESONIDE-FORMOTEROL FUMARATE 80-4.5 MCG/ACT IN AERO: 2.0000 | INHALATION_SPRAY | Freq: Two times a day (BID) | RESPIRATORY_TRACT | 5 refills | Status: DC

## 2020-12-19 NOTE — Telephone Encounter (Signed)
Called and left a message for patient to inform her that the Symbicort has been sent to the [facility] pharmacy of her choice.

## 2020-12-19 NOTE — Telephone Encounter (Signed)
Patient called requesting dr. Dellis Anes take over her Symbicort prescription. Patient is in need of a refill, [facility] - [address]  Best contact number: [phone]

## 2021-02-06 DIAGNOSIS — J302 Other seasonal allergic rhinitis: Secondary | ICD-10-CM | POA: Diagnosis not present

## 2021-02-06 DIAGNOSIS — I10 Essential (primary) hypertension: Secondary | ICD-10-CM | POA: Diagnosis not present

## 2021-02-06 DIAGNOSIS — Z23 Encounter for immunization: Secondary | ICD-10-CM | POA: Diagnosis not present

## 2021-02-06 DIAGNOSIS — J4520 Mild intermittent asthma, uncomplicated: Secondary | ICD-10-CM | POA: Diagnosis not present

## 2021-02-06 DIAGNOSIS — H4089 Other specified glaucoma: Secondary | ICD-10-CM | POA: Diagnosis not present

## 2021-02-06 DIAGNOSIS — E039 Hypothyroidism, unspecified: Secondary | ICD-10-CM | POA: Diagnosis not present

## 2021-02-06 DIAGNOSIS — L659 Nonscarring hair loss, unspecified: Secondary | ICD-10-CM | POA: Diagnosis not present

## 2021-02-08 DIAGNOSIS — H401122 Primary open-angle glaucoma, left eye, moderate stage: Secondary | ICD-10-CM | POA: Diagnosis not present

## 2021-02-08 DIAGNOSIS — H401113 Primary open-angle glaucoma, right eye, severe stage: Secondary | ICD-10-CM | POA: Diagnosis not present

## 2021-02-14 DIAGNOSIS — H401122 Primary open-angle glaucoma, left eye, moderate stage: Secondary | ICD-10-CM | POA: Diagnosis not present

## 2021-02-14 DIAGNOSIS — H401113 Primary open-angle glaucoma, right eye, severe stage: Secondary | ICD-10-CM | POA: Diagnosis not present

## 2021-03-22 ENCOUNTER — Other Ambulatory Visit: Payer: Self-pay

## 2021-03-22 ENCOUNTER — Ambulatory Visit: Payer: Medicare HMO | Admitting: Allergy & Immunology

## 2021-03-22 VITALS — BP 130/62 | HR 66 | Temp 97.3°F | Resp 16 | Ht 62.0 in | Wt 126.6 lb

## 2021-03-22 DIAGNOSIS — L2089 Other atopic dermatitis: Secondary | ICD-10-CM | POA: Diagnosis not present

## 2021-03-22 DIAGNOSIS — J3089 Other allergic rhinitis: Secondary | ICD-10-CM | POA: Diagnosis not present

## 2021-03-22 DIAGNOSIS — J449 Chronic obstructive pulmonary disease, unspecified: Secondary | ICD-10-CM

## 2021-03-22 MED ORDER — BUDESONIDE-FORMOTEROL FUMARATE 80-4.5 MCG/ACT IN AERO: 2.0000 | INHALATION_SPRAY | Freq: Two times a day (BID) | RESPIRATORY_TRACT | 5 refills | Status: DC

## 2021-03-22 MED ORDER — IPRATROPIUM BROMIDE 0.06 % NA SOLN: 2.0000 | Freq: Three times a day (TID) | NASAL | 5 refills | Status: DC

## 2021-03-22 NOTE — Progress Notes (Signed)
FOLLOW UP  Date of Service/Encounter:  03/22/21   Assessment:   Perennial allergic rhinitis   Asthma-COPD overlap syndrome - GOLD A    Eczema - controlled with triamcinolone as needed   Overall, Ms. Kara Green is doing quite well.  Her spirometry is stable.  I think this might just be her new normal.  She continues on the Symbicort and feels much clinical relief from that.  She has not needed prednisone nor has she needed albuterol.  We are going to continue with the same regimen for now.  Allergic rhinitis is controlled with the ipratropium as needed.  I do not think allergy shots are indicated and might not even be useful, as she is only positive to indoor molds.  Plan/Recommendations:   1. Perennial allergic rhinitis (indoor mold mix) - Continue with nasal ipratropium one spray per nostril daily (can increase to twice daily on bad days). - This can be over drying so be careful. - You seem to have a good handle on your symptoms.  2. Asthma-COPD overlap syndrome - We are not going to make any changes at all. - Your breathing tested looked stable with an FEV1 of 53% or so (I am happy with stability).  - Daily controller medication(s): Symbicort 80/4.5mcg two puffs twice daily with spacer - Prior to physical activity: albuterol 2 puffs 10-15 minutes before physical activity. - Rescue medications: albuterol 4 puffs every 4-6 hours as needed - Asthma control goals:  * Full participation in all desired activities (may need albuterol before activity) * Albuterol use two time or less a week on average (not counting use with activity) * Cough interfering with sleep two time or less a month * Oral steroids no more than once a year * No hospitalizations  3. Return in about 6 months (around 09/19/2021).     Subjective:   Kara Green is a 80 y.o. female presenting today for follow up of  Chief Complaint  Patient presents with   Follow-up    Patient in today for a follow up  and has no new problems.    Kara Green has a history of the following: Patient Active Problem List   Diagnosis Date Noted   Pulmonary emphysema (HCC) 12/02/2019   IBS (irritable bowel syndrome) 06/07/2019   Osteoarthritis 01/01/2017   HTN (hypertension) 01/01/2017   Hypothyroidism 01/01/2017    History obtained from: chart review and patient.  Kara Green is a 80 y.o. female presenting for a follow up visit.  She was last seen in May 2022.  At that time, would continue with nasal ipratropium 1 spray per nostril daily.  For the asthma COPD overlap, we did not make any changes.  We continue with Symbicort 2 puffs twice daily with spacer as well as albuterol as needed.  Since last visit, she has done well.   Asthma/Respiratory Symptom History: She was getting along with Symbicort two puffs in the morning. Around one month ago, she started having some trouble breathing "deep inside". She reports that she could not lay on her left side because the breathing was not as good. She reports that something was pressing on her "bones" on her chest. This lasted 3-4 days.  She remains on the two puffs BID and she is thinking about decreasing back to once daily.   Her eye pressure was better on the two puffs once daily compared to the two puffs twice daily. She is going to drop the second one now.  She has not  rechecked her eye pressure in quite some time.  Allergic Rhinitis Symptom History: She is only on the cetirizine once daily. She is doing one squirt of the nasal spray once daily; she can increase to twice daily when it gets particularly.  She has not required any antibiotics.  Otherwise, there have been no changes to her past medical history, surgical history, family history, or social history.    Review of Systems  Constitutional: Negative.  Negative for chills, fever, malaise/fatigue and weight loss.  HENT:  Positive for congestion. Negative for ear discharge and ear pain.   Eyes:   Negative for pain, discharge and redness.  Respiratory:  Negative for cough, sputum production, shortness of breath and wheezing.   Cardiovascular: Negative.  Negative for chest pain and palpitations.  Gastrointestinal:  Negative for abdominal pain, constipation, diarrhea, heartburn, nausea and vomiting.  Skin: Negative.  Negative for itching and rash.  Neurological:  Negative for dizziness and headaches.  Endo/Heme/Allergies:  Positive for environmental allergies. Does not bruise/bleed easily.      Objective:   Blood pressure 130/62, pulse 66, temperature (!) 97.3 F (36.3 C), temperature source Temporal, resp. rate 16, height 5\' 2"  (1.575 m), weight 126 lb 9.6 oz (57.4 kg), SpO2 95 %. Body mass index is 23.16 kg/m.   Physical Exam:  Physical Exam Vitals reviewed.  Constitutional:      Appearance: She is well-developed.     Comments: Very pleasant.  HENT:     Head: Normocephalic and atraumatic.     Right Ear: Tympanic membrane, ear canal and external ear normal.     Left Ear: Tympanic membrane, ear canal and external ear normal.     Nose: Mucosal edema present. No nasal deformity, septal deviation or rhinorrhea.     Right Turbinates: Not enlarged or swollen.     Left Turbinates: Not enlarged or swollen.     Right Sinus: No maxillary sinus tenderness or frontal sinus tenderness.     Left Sinus: No maxillary sinus tenderness or frontal sinus tenderness.     Mouth/Throat:     Mouth: Mucous membranes are not pale and not dry.     Pharynx: Uvula midline.  Eyes:     General: Lids are normal. No allergic shiner.       Right eye: No discharge.        Left eye: No discharge.     Conjunctiva/sclera: Conjunctivae normal.     Right eye: Right conjunctiva is not injected. No chemosis.    Left eye: Left conjunctiva is not injected. No chemosis.    Pupils: Pupils are equal, round, and reactive to light.  Cardiovascular:     Rate and Rhythm: Normal rate and regular rhythm.     Heart  sounds: Normal heart sounds.  Pulmonary:     Effort: Pulmonary effort is normal. No tachypnea, accessory muscle usage or respiratory distress.     Breath sounds: Normal breath sounds. No wheezing, rhonchi or rales.     Comments: No wheezes or crackles. Chest:     Chest wall: No tenderness.  Lymphadenopathy:     Cervical: No cervical adenopathy.  Skin:    Coloration: Skin is not pale.     Findings: No abrasion, erythema, petechiae or rash. Rash is not papular, urticarial or vesicular.  Neurological:     Mental Status: She is alert.  Psychiatric:        Behavior: Behavior is cooperative.     Diagnostic studies:    Spirometry: results  abnormal (FEV1: 0.90/52%, FVC: 1.64/71%, FEV1/FVC: 55%).    Spirometry consistent with moderate obstructive disease.   Allergy Studies: none        Malachi Bonds, MD  [facility]

## 2021-03-22 NOTE — Patient Instructions (Addendum)
1. Perennial allergic rhinitis (indoor mold mix) - Continue with nasal ipratropium one spray per nostril daily (can increase to twice daily on bad days). - This can be over drying so be careful. - You seem to have a good handle on your symptoms.  2. Asthma-COPD overlap syndrome - We are not going to make any changes at all. - Your breathing tested looked stable with an FEV1 of 53% or so (I am happy with stability).  - Daily controller medication(s): Symbicort 80/4.109mcg two puffs twice daily with spacer - Prior to physical activity: albuterol 2 puffs 10-15 minutes before physical activity. - Rescue medications: albuterol 4 puffs every 4-6 hours as needed - Asthma control goals:  * Full participation in all desired activities (may need albuterol before activity) * Albuterol use two time or less a week on average (not counting use with activity) * Cough interfering with sleep two time or less a month * Oral steroids no more than once a year * No hospitalizations  3. Return in about 6 months (around 09/19/2021).    Please inform us of any Emergency Department visits, hospitalizations, or changes in symptoms. Call us before going to the ED for breathing or allergy symptoms since we might be able to fit you in for a sick visit. Feel free to contact us anytime with any questions, problems, or concerns.  It was a pleasure to see you again today!  Websites that have reliable patient information: 1. American Academy of Asthma, Allergy, and Immunology: www.aaaai.org 2. Food Allergy Research and Education (FARE): foodallergy.org 3. Mothers of Asthmatics: http://www.asthmacommunitynetwork.org 4. American College of Allergy, Asthma, and Immunology: www.acaai.org   COVID-19 Vaccine Information can be found at: PodExchange.nl For questions related to vaccine distribution or appointments, please email [email or web address] or call  [phone].   We realize that you might be concerned about having an allergic reaction to the COVID19 vaccines. To help with that concern, WE ARE OFFERING THE COVID19 VACCINES IN OUR OFFICE! Ask the front desk for dates!     "Like" [place] on Facebook and Instagram for our latest updates!      A healthy democracy works best when [employer] participate! Make sure you are registered to vote! If you have moved or changed any of your contact information, you will need to get this updated before voting!  In some cases, you MAY be able to register to vote online: [email or web address]

## 2021-03-23 ENCOUNTER — Encounter: Payer: Self-pay | Admitting: Allergy & Immunology

## 2021-03-23 NOTE — Addendum Note (Signed)
Addended by: Robet Leu A on: 03/23/2021 02:25 PM   Modules accepted: Orders

## 2021-05-02 DIAGNOSIS — J4520 Mild intermittent asthma, uncomplicated: Secondary | ICD-10-CM | POA: Diagnosis not present

## 2021-05-02 DIAGNOSIS — M19049 Primary osteoarthritis, unspecified hand: Secondary | ICD-10-CM | POA: Diagnosis not present

## 2021-05-02 DIAGNOSIS — Z0000 Encounter for general adult medical examination without abnormal findings: Secondary | ICD-10-CM | POA: Diagnosis not present

## 2021-05-02 DIAGNOSIS — H4089 Other specified glaucoma: Secondary | ICD-10-CM | POA: Diagnosis not present

## 2021-05-02 DIAGNOSIS — I10 Essential (primary) hypertension: Secondary | ICD-10-CM | POA: Diagnosis not present

## 2021-05-02 DIAGNOSIS — L659 Nonscarring hair loss, unspecified: Secondary | ICD-10-CM | POA: Diagnosis not present

## 2021-05-02 DIAGNOSIS — E559 Vitamin D deficiency, unspecified: Secondary | ICD-10-CM | POA: Diagnosis not present

## 2021-05-02 DIAGNOSIS — J302 Other seasonal allergic rhinitis: Secondary | ICD-10-CM | POA: Diagnosis not present

## 2021-05-02 DIAGNOSIS — M8589 Other specified disorders of bone density and structure, multiple sites: Secondary | ICD-10-CM | POA: Diagnosis not present

## 2021-05-02 DIAGNOSIS — E039 Hypothyroidism, unspecified: Secondary | ICD-10-CM | POA: Diagnosis not present

## 2021-07-09 DIAGNOSIS — H401113 Primary open-angle glaucoma, right eye, severe stage: Secondary | ICD-10-CM | POA: Diagnosis not present

## 2021-07-09 DIAGNOSIS — H401122 Primary open-angle glaucoma, left eye, moderate stage: Secondary | ICD-10-CM | POA: Diagnosis not present

## 2021-08-18 DIAGNOSIS — M419 Scoliosis, unspecified: Secondary | ICD-10-CM

## 2021-08-18 HISTORY — DX: Scoliosis, unspecified: M41.9

## 2021-08-21 ENCOUNTER — Ambulatory Visit: Payer: Medicare HMO | Admitting: Podiatry

## 2021-08-21 ENCOUNTER — Ambulatory Visit (INDEPENDENT_AMBULATORY_CARE_PROVIDER_SITE_OTHER): Payer: Medicare HMO

## 2021-08-21 DIAGNOSIS — M21611 Bunion of right foot: Secondary | ICD-10-CM | POA: Diagnosis not present

## 2021-08-21 DIAGNOSIS — M79671 Pain in right foot: Secondary | ICD-10-CM

## 2021-08-21 DIAGNOSIS — M79672 Pain in left foot: Secondary | ICD-10-CM | POA: Diagnosis not present

## 2021-08-21 DIAGNOSIS — M21612 Bunion of left foot: Secondary | ICD-10-CM

## 2021-08-21 NOTE — Patient Instructions (Signed)
You can use urea nail gel on the toenail ?

## 2021-08-22 DIAGNOSIS — M1612 Unilateral primary osteoarthritis, left hip: Secondary | ICD-10-CM | POA: Diagnosis not present

## 2021-08-22 DIAGNOSIS — M47816 Spondylosis without myelopathy or radiculopathy, lumbar region: Secondary | ICD-10-CM | POA: Diagnosis not present

## 2021-08-27 NOTE — Progress Notes (Signed)
Subjective:  ? ?Patient ID: Kara Green, female   DOB: 81 y.o.   MRN: [id number]  ? ?HPI ?81 year old female presents the office today for concerns of bunion on her right foot.  She states the bunion pushes on the second toe.  This started years ago.  She also has ingrown toenail left big toe on the hallux.  Denies any drainage or pus.   No open sores.  No other concerns. ? ? ?Review of Systems  ?All other systems reviewed and are negative. ? ?Past Medical History:  ?Diagnosis Date  ? Allergy   ? Arthritis   ? Cataract   ? Glaucoma   ? Hypertension   ? IBS (irritable bowel syndrome)   ? Thyroid disease   ? UTI (urinary tract infection)   ? ? ?Past Surgical History:  ?Procedure Laterality Date  ? ABDOMINAL HYSTERECTOMY  1981  ? total  ? APPENDECTOMY  1981  ? cataracts Bilateral   ? HERNIA REPAIR  1980  ? TONSILLECTOMY AND ADENOIDECTOMY    ? ? ? ?Current Outpatient Medications:  ?  Acetaminophen (TYLENOL ARTHRITIS PAIN PO), Take by mouth daily., Disp: , Rfl:  ?  amLODipine (NORVASC) 10 MG tablet, TAKE 1 TABLET BY MOUTH EVERY DAY, Disp: 90 tablet, Rfl: 0 ?  APPLE CIDER VINEGAR PO, Take by mouth., Disp: , Rfl:  ?  apraclonidine (IOPIDINE) 0.5 % ophthalmic solution, Place 1 drop into both eyes 2 (two) times daily., Disp: , Rfl:  ?  Ascorbic Acid (VITAMIN C) 100 MG tablet, Take 100 mg by mouth daily., Disp: , Rfl:  ?  aspirin EC 81 MG tablet, Take 81 mg by mouth daily., Disp: , Rfl:  ?  budesonide-formoterol (SYMBICORT) 80-4.5 MCG/ACT inhaler, Inhale 2 puffs into the lungs 2 (two) times daily. With spacer in the morning and at bedtime., Disp: 10.2 each, Rfl: 5 ?  Cholecalciferol (VITAMIN D) 2000 units CAPS, Take 1 capsule by mouth daily., Disp: , Rfl:  ?  CINNAMON PO, Take by mouth., Disp: , Rfl:  ?  Coconut Oil 1000 MG CAPS, Take by mouth daily., Disp: , Rfl:  ?  Collagen 500 MG CAPS, Take by mouth daily., Disp: , Rfl:  ?  Garlic 10 MG CAPS, Take by mouth daily., Disp: , Rfl:  ?  glucosamine-chondroitin 500-400 MG  tablet, Take 1 tablet by mouth daily., Disp: , Rfl:  ?  ipratropium (ATROVENT) 0.06 % nasal spray, Place 2 sprays into both nostrils 3 (three) times daily., Disp: 15 mL, Rfl: 5 ?  latanoprost (XALATAN) 0.005 % ophthalmic solution, Place 1 drop into both eyes at bedtime., Disp: , Rfl:  ?  levothyroxine (SYNTHROID) 50 MCG tablet, TAKE 1 TABLET BY MOUTH EVERY DAY, Disp: 90 tablet, Rfl: 0 ?  loratadine (KLS ALLERCLEAR) 10 MG tablet, Take 10 mg by mouth daily. , Disp: , Rfl:  ?  Magnesium 400 MG CAPS, Take by mouth daily., Disp: , Rfl:  ?  naproxen sodium (ALEVE) 220 MG tablet, Take 220 mg by mouth daily as needed (on rare occasion as needed for pain)., Disp: , Rfl:  ?  Omega-3 Fatty Acids (FISH OIL) 1200 MG CAPS, Take by mouth daily., Disp: , Rfl:  ?  Prenatal Vit-Fe Fumarate-FA (PRENATAL MULTIVITAMIN) TABS tablet, Take 1 tablet by mouth daily at 12 noon., Disp: , Rfl:  ?  spironolactone (ALDACTONE) 25 MG tablet, TAKE 2 TABLETS BY MOUTH EVERY DAY, Disp: 180 tablet, Rfl: 0 ?  timolol (BETIMOL) 0.25 % ophthalmic solution, Place 1-2  drops into both eyes 2 (two) times daily., Disp: , Rfl:  ?  timolol (TIMOPTIC) 0.5 % ophthalmic solution, SMARTSIG:In Eye(s), Disp: , Rfl:  ?  triamcinolone cream (KENALOG) 0.1 %, Apply 1 application topically 2 (two) times daily., Disp: 30 g, Rfl: 0 ?  TURMERIC PO, Take 720 mg by mouth daily., Disp: , Rfl:  ?  vitamin E 100 UNIT capsule, Take by mouth daily., Disp: , Rfl:  ? ?Allergies  ?Allergen Reactions  ? Acetazolamide Other (See Comments)  ?  Blurred vision, Headache, fatigue, loss of appetite  ? Benadryl [Diphenhydramine Hcl]   ?  hyperactive  ? Codeine   ? Neurontin [Gabapentin]   ?  unknown  ? Penicillins   ? Sulfa Antibiotics   ? Tetracyclines & Related   ? Decongestant [Oxymetazoline]   ?  Unsure on reaction, was told to never take again ?  ? ? ? ? ? ?   ?Objective:  ?Physical Exam  ?General: AAO x3, NAD ? ?Dermatological: Right second digit toenail is hypertrophic, dystrophic.  No  edema, erythema or signs of infection per there is no open lesions.  Mild incurvation left hallux toenail without any signs of infection. ? ?Vascular: Dorsalis Pedis artery and Posterior Tibial artery pedal pulses are 2/4 bilateral with immedate capillary fill time. There is no pain with calf compression, swelling, warmth, erythema.  ? ?Neruologic: Grossly intact via light touch bilateral.  ? ?Musculoskeletal: Bunions present bilaterally with digital contracture.  Given the hallux abductus on the right side it is putting pressure on to the second digit and the rubbing causing discomfort.  Bunion also present in the left side.  Muscular strength 5/5 in all groups tested bilateral. ? ?Gait: Unassisted, Nonantalgic.  ? ? ?   ?Assessment:  ? ?81 year old female with onychomycosis, onychodystrophy; Bunion deformity ? ?   ?Plan:  ?-Treatment options discussed including all alternatives, risks, and complications ?-Etiology of symptoms were discussed ?-X-rays were obtained and reviewed with the patient.  3 views of bilateral feet were obtained.  Bunion deformity is noted with right side more severe than left.  Noted subacute fracture. ?-Regards to the nail we discussed urea gel topically. ?-Regards to the bunions discussed the modifications, offloading and padding.  Ultimately not able to correct the bunion without doing surgery but offloading and shoe modifications could be helpful. ?-Monitor for any skin breakdown or irritation of the skin otherwise ? ?Return if symptoms worsen or fail to improve. ? ?Vivi Barrack DPM ? ?   ? ?

## 2021-09-18 ENCOUNTER — Other Ambulatory Visit: Payer: Self-pay | Admitting: Podiatry

## 2021-09-18 DIAGNOSIS — M21611 Bunion of right foot: Secondary | ICD-10-CM

## 2021-09-18 DIAGNOSIS — M21612 Bunion of left foot: Secondary | ICD-10-CM

## 2021-09-27 ENCOUNTER — Ambulatory Visit: Payer: Medicare HMO | Admitting: Allergy & Immunology

## 2021-09-27 ENCOUNTER — Encounter: Payer: Self-pay | Admitting: Allergy & Immunology

## 2021-09-27 VITALS — BP 148/60 | HR 65 | Temp 98.0°F | Resp 16 | Ht 59.45 in | Wt 130.4 lb

## 2021-09-27 DIAGNOSIS — J3089 Other allergic rhinitis: Secondary | ICD-10-CM

## 2021-09-27 DIAGNOSIS — J449 Chronic obstructive pulmonary disease, unspecified: Secondary | ICD-10-CM

## 2021-09-27 DIAGNOSIS — L2089 Other atopic dermatitis: Secondary | ICD-10-CM | POA: Diagnosis not present

## 2021-09-27 MED ORDER — ALBUTEROL SULFATE HFA 108 (90 BASE) MCG/ACT IN AERS: 2.0000 | INHALATION_SPRAY | Freq: Four times a day (QID) | RESPIRATORY_TRACT | 2 refills | Status: DC | PRN

## 2021-09-27 MED ORDER — BUDESONIDE-FORMOTEROL FUMARATE 80-4.5 MCG/ACT IN AERO: 2.0000 | INHALATION_SPRAY | Freq: Two times a day (BID) | RESPIRATORY_TRACT | 5 refills | Status: DC

## 2021-09-27 MED ORDER — IPRATROPIUM BROMIDE 0.06 % NA SOLN: 2.0000 | Freq: Three times a day (TID) | NASAL | 5 refills | Status: DC

## 2021-09-27 NOTE — Patient Instructions (Addendum)
1. Perennial allergic rhinitis (indoor mold mix) ?- Continue with nasal ipratropium one spray per nostril daily (can increase to twice daily on bad days). ?- This can be over drying so be careful. ?- You seem to have a good handle on your symptoms. ? ?2. Asthma-COPD overlap syndrome ?- We are not going to make any changes at all. ?- Your breathing tested looked stable with an FEV1 of 57% or so (I am happy with stability).  ?- Daily controller medication(s): Symbicort 80/4.68mcg two puffs 1-2 times daily with spacer ?- Prior to physical activity: albuterol 2 puffs 10-15 minutes before physical activity. ?- Rescue medications: albuterol 4 puffs every 4-6 hours as needed ?- Asthma control goals:  ?* Full participation in all desired activities (may need albuterol before activity) ?* Albuterol use two time or less a week on average (not counting use with activity) ?* Cough interfering with sleep two time or less a month ?* Oral steroids no more than once a year ?* No hospitalizations ? ?3. Return in about 6 months (around 03/30/2022).  ? ? ?Please inform us of any Emergency Department visits, hospitalizations, or changes in symptoms. Call us before going to the ED for breathing or allergy symptoms since we might be able to fit you in for a sick visit. Feel free to contact us anytime with any questions, problems, or concerns. ? ?It was a pleasure to see you again today! ? ?Websites that have reliable patient information: ?1. American Academy of Asthma, Allergy, and Immunology: www.aaaai.org ?2. Food Allergy Research and Education (FARE): foodallergy.org ?3. Mothers of Asthmatics: http://www.asthmacommunitynetwork.org ?4. [employer] of Allergy, Asthma, and Immunology: MissingWeapons.ca ? ? ?COVID-19 Vaccine Information can be found at: PodExchange.nl For questions related to vaccine distribution or appointments, please email [email or web address] or call  [phone].  ? ?We realize that you might be concerned about having an allergic reaction to the COVID19 vaccines. To help with that concern, WE ARE OFFERING THE COVID19 VACCINES IN OUR OFFICE! Ask the front desk for dates!  ? ? ? ??Like? [place] on Facebook and Instagram for our latest updates!  ?  ? ? ?A healthy democracy works best when [employer] participate! Make sure you are registered to vote! If you have moved or changed any of your contact information, you will need to get this updated before voting! ? ?In some cases, you MAY be able to register to vote online: [email or web address] ? ? ? ? ?

## 2021-09-27 NOTE — Progress Notes (Signed)
? ?FOLLOW UP ? ?Date of Service/Encounter:  09/27/21 ? ?Consult requested by: Ollen Bowl, MD ? ? ?Assessment:  ? ?Perennial allergic rhinitis ?  ?Asthma-COPD overlap syndrome - GOLD A  ?  ?Eczema - controlled with triamcinolone as needed ? ?Plan/Recommendations:  ? ?1. Perennial allergic rhinitis (indoor mold mix) ?- Continue with nasal ipratropium one spray per nostril daily (can increase to twice daily on bad days). ?- This can be over drying so be careful. ?- You seem to have a good handle on your symptoms. ? ?2. Asthma-COPD overlap syndrome ?- We are not going to make any changes at all. ?- Your breathing tested looked stable with an FEV1 of 57% or so (I am happy with stability).  ?- Daily controller medication(s): Symbicort 80/4.78mcg two puffs 1-2 times daily with spacer ?- Prior to physical activity: albuterol 2 puffs 10-15 minutes before physical activity. ?- Rescue medications: albuterol 4 puffs every 4-6 hours as needed ?- Asthma control goals:  ?* Full participation in all desired activities (may need albuterol before activity) ?* Albuterol use two time or less a week on average (not counting use with activity) ?* Cough interfering with sleep two time or less a month ?* Oral steroids no more than once a year ?* No hospitalizations ? ?3. Return in about 6 months (around 03/30/2022).  ? ?This note in its entirety was forwarded to the Provider who requested this consultation. ? ?Subjective:  ? ?Kara Green is a 81 y.o. female presenting today for evaluation of  ?Chief Complaint  ?Patient presents with  ? Follow-up  ?  No complaints or issues at this time.  ? ? ?Kara Green has a history of the following: ?Patient Active Problem List  ? Diagnosis Date Noted  ? Pulmonary emphysema (HCC) 12/02/2019  ? IBS (irritable bowel syndrome) 06/07/2019  ? Osteoarthritis 01/01/2017  ? HTN (hypertension) 01/01/2017  ? Hypothyroidism 01/01/2017  ? ? ?History obtained from: chart review and  patient. ? ?Kara Green was referred by Ollen Bowl, MD.    ? ?Kara Green is a 81 y.o. female presenting for a follow up visit.  She was last seen in November 2022.  At that time, we continue with nasal ipratropium 1 spray per nostril daily.  For her asthma, she was stable with an FEV1 of 53%.  We continued Symbicort 80 mcg 2 puffs twice daily as well as albuterol as needed.   ? ?Since last visit, she has done very well. She did the Symbicort two puffs in the morning and she has been fine with that. She has high IOP and she has been limiting the Symbicort because of the nasal steroid.  ?  ?Asthma/Respiratory Symptom History: She has not been needing her albuterol. She actually  got the albuterol once, but she reported that she paid a lot of money for it she never ended up using it. She would prefer to know pay for medications that she does not use at all. Normally she sleeps well through the night.  ? ?Allergic Rhinitis Symptom History: She has been having some issues with pollen.  She has some nasal drainage until she uses her nose sprays in the morning, and this clears it out.  She has not needed any antibiotics for any sinus infections.  She overall is doing very well. ? ?She was diagnosed with scoliosis. She has been going to physical therapy for this.  It seems to be helping.  She also reports that she has done some needling  for some pain in the past as well. ? ?She is not planning to take any trips anytime soon. ? ?Otherwise, there is no history of other atopic diseases, including food allergies, drug allergies, stinging insect allergies, eczema, urticaria, or contact dermatitis. There is no significant infectious history. Vaccinations are up to date.  ? ? ? ?Review of Systems  ?Constitutional: Negative.  Negative for chills, fever, malaise/fatigue and weight loss.  ?HENT: Negative.  Negative for congestion, ear discharge and ear pain.   ?Eyes:  Negative for pain, discharge and redness.  ?Respiratory:   Negative for cough, sputum production, shortness of breath and wheezing.   ?Cardiovascular: Negative.  Negative for chest pain and palpitations.  ?Gastrointestinal:  Negative for abdominal pain, constipation, diarrhea, heartburn, nausea and vomiting.  ?Skin: Negative.  Negative for itching and rash.  ?Neurological:  Negative for dizziness and headaches.  ?Endo/Heme/Allergies:  Negative for environmental allergies. Does not bruise/bleed easily.   ? ? ? ?Objective:  ? ?Blood pressure (!) 148/60, pulse 65, temperature 98 ?F (36.7 ?C), temperature source Temporal, resp. rate 16, height 4' 11.45" (1.51 m), weight 130 lb 6.4 oz (59.1 kg), SpO2 95 %. ?Body mass index is 25.94 kg/m?. ? ? ? ? ?Physical Exam ?Vitals reviewed.  ?Constitutional:   ?   Appearance: She is well-developed.  ?   Comments: Very pleasant.  ?HENT:  ?   Head: Normocephalic and atraumatic.  ?   Right Ear: Tympanic membrane, ear canal and external ear normal.  ?   Left Ear: Tympanic membrane, ear canal and external ear normal.  ?   Nose: Mucosal edema present. No nasal deformity, septal deviation or rhinorrhea.  ?   Right Turbinates: Not enlarged or swollen.  ?   Left Turbinates: Not enlarged or swollen.  ?   Right Sinus: No maxillary sinus tenderness or frontal sinus tenderness.  ?   Left Sinus: No maxillary sinus tenderness or frontal sinus tenderness.  ?   Mouth/Throat:  ?   Mouth: Mucous membranes are not pale and not dry.  ?   Pharynx: Uvula midline.  ?Eyes:  ?   General: Lids are normal. No allergic shiner.    ?   Right eye: No discharge.     ?   Left eye: No discharge.  ?   Conjunctiva/sclera: Conjunctivae normal.  ?   Right eye: Right conjunctiva is not injected. No chemosis. ?   Left eye: Left conjunctiva is not injected. No chemosis. ?   Pupils: Pupils are equal, round, and reactive to light.  ?Cardiovascular:  ?   Rate and Rhythm: Normal rate and regular rhythm.  ?   Heart sounds: Normal heart sounds.  ?Pulmonary:  ?   Effort: Pulmonary effort  is normal. No tachypnea, accessory muscle usage or respiratory distress.  ?   Breath sounds: Normal breath sounds. No wheezing, rhonchi or rales.  ?   Comments: No wheezes or crackles. ?Chest:  ?   Chest wall: No tenderness.  ?Lymphadenopathy:  ?   Cervical: No cervical adenopathy.  ?Skin: ?   Coloration: Skin is not pale.  ?   Findings: No abrasion, erythema, petechiae or rash. Rash is not papular, urticarial or vesicular.  ?Neurological:  ?   Mental Status: She is alert.  ?Psychiatric:     ?   Behavior: Behavior is cooperative.  ?  ? ?Diagnostic studies:   ? ?Spirometry: results abnormal (FEV1: 0.89/57%, FVC: 1.54/75%, FEV1/FVC: 58%).  ?  ?Spirometry consistent with severe obstructive disease.  Levels are stable.  ? ? ? ?Allergy Studies: none ? ? ? ? ? ? ? ? ?  ?Malachi Bonds, MD ?[facility] ? ? ? ? ? ?

## 2021-10-03 DIAGNOSIS — M65341 Trigger finger, right ring finger: Secondary | ICD-10-CM | POA: Diagnosis not present

## 2021-10-03 DIAGNOSIS — M47816 Spondylosis without myelopathy or radiculopathy, lumbar region: Secondary | ICD-10-CM | POA: Diagnosis not present

## 2021-10-17 DIAGNOSIS — L728 Other follicular cysts of the skin and subcutaneous tissue: Secondary | ICD-10-CM | POA: Diagnosis not present

## 2021-11-14 DIAGNOSIS — H401113 Primary open-angle glaucoma, right eye, severe stage: Secondary | ICD-10-CM | POA: Diagnosis not present

## 2021-11-14 DIAGNOSIS — H401122 Primary open-angle glaucoma, left eye, moderate stage: Secondary | ICD-10-CM | POA: Diagnosis not present

## 2022-04-02 ENCOUNTER — Ambulatory Visit: Payer: Medicare HMO | Admitting: Allergy & Immunology

## 2022-04-04 ENCOUNTER — Ambulatory Visit: Payer: Medicare HMO | Admitting: Allergy & Immunology

## 2022-04-04 VITALS — BP 130/52 | HR 66 | Temp 97.8°F | Resp 16 | Ht 59.75 in | Wt 126.8 lb

## 2022-04-04 DIAGNOSIS — J3089 Other allergic rhinitis: Secondary | ICD-10-CM

## 2022-04-04 DIAGNOSIS — L2089 Other atopic dermatitis: Secondary | ICD-10-CM | POA: Diagnosis not present

## 2022-04-04 DIAGNOSIS — J4489 Other specified chronic obstructive pulmonary disease: Secondary | ICD-10-CM | POA: Diagnosis not present

## 2022-04-04 MED ORDER — FLUTICASONE FUROATE-VILANTEROL 100-25 MCG/ACT IN AEPB: 1.0000 | INHALATION_SPRAY | Freq: Every day | RESPIRATORY_TRACT | 5 refills | Status: AC

## 2022-04-04 NOTE — Progress Notes (Signed)
d 

## 2022-04-04 NOTE — Patient Instructions (Addendum)
1. Perennial allergic rhinitis (indoor mold mix) - Continue with nasal ipratropium one spray per nostril daily (can increase to twice daily on bad days). - This can be over drying so be careful. - You seem to have a good handle on your symptoms.  2. 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  - Your scoliosis might explain why your lung function is not totally perfect.  - We are going to change from Symbicort to [facility] one puff once daily. - I am going with the lower dose due to your increased pressure in your eyes.  - You can certainly try to see how you are doing without the every day inhaler at all.  - Daily controller medication(s): Breo one puff once daily  - Prior to physical activity: albuterol 2 puffs 10-15 minutes before physical activity. - Rescue medications: albuterol 4 puffs every 4-6 hours as needed - Asthma control goals:  * Full participation in all desired activities (may need albuterol before activity) * Albuterol use two time or less a week on average (not counting use with activity) * Cough interfering with sleep two time or less a month * Oral steroids no more than once a year * No hospitalizations  3. Return in about 6 months (around 10/03/2022).    Please inform us of any Emergency Department visits, hospitalizations, or changes in symptoms. Call us before going to the ED for breathing or allergy symptoms since we might be able to fit you in for a sick visit. Feel free to contact us anytime with any questions, problems, or concerns.  It was a pleasure to see you again today!  Websites that have reliable patient information: 1. American Academy of Asthma, Allergy, and Immunology: www.aaaai.org 2. Food Allergy Research and Education (FARE): foodallergy.org 3. Mothers of Asthmatics: http://www.asthmacommunitynetwork.org 4. American College of Allergy, Asthma, and Immunology: www.acaai.org   COVID-19 Vaccine Information can be found at:  [email or web address] For questions related to vaccine distribution or appointments, please email [email or web address] or call [phone].   We realize that you might be concerned about having an allergic reaction to the COVID19 vaccines. To help with that concern, WE ARE OFFERING THE COVID19 VACCINES IN OUR OFFICE! Ask the front desk for dates!     "Like" [place] on Facebook and Instagram for our latest updates!      A healthy democracy works best when [employer] participate! Make sure you are registered to vote! If you have moved or changed any of your contact information, you will need to get this updated before voting!  In some cases, you MAY be able to register to vote online: [email or web address]

## 2022-04-04 NOTE — Progress Notes (Signed)
FOLLOW UP  Date of Service/Encounter:  04/04/22   Assessment:   Perennial allergic rhinitis   Asthma-COPD overlap syndrome - GOLD A    Eczema - controlled with triamcinolone as needed  Plan/Recommendations:   1. Perennial allergic rhinitis (indoor mold mix) - Continue with nasal ipratropium one spray per nostril daily (can increase to twice daily on bad days). - This can be over drying so be careful. - You seem to have a good handle on your symptoms.  2. 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  - Your scoliosis might explain why your lung function is not totally perfect.  - We are going to change from Symbicort to [facility] one puff once daily. - I am going with the lower dose due to your increased pressure in your eyes.  - You can certainly try to see how you are doing without the every day inhaler at all.  - Daily controller medication(s): Breo one puff once daily  - Prior to physical activity: albuterol 2 puffs 10-15 minutes before physical activity. - Rescue medications: albuterol 4 puffs every 4-6 hours as needed - Asthma control goals:  * Full participation in all desired activities (may need albuterol before activity) * Albuterol use two time or less a week on average (not counting use with activity) * Cough interfering with sleep two time or less a month * Oral steroids no more than once a year * No hospitalizations  3. Return in about 6 months (around 10/03/2022).    Subjective:   Kara Green is a 81 y.o. female presenting today for follow up of  Chief Complaint  Patient presents with   Asthma    No issues.     Kara Green has a history of the following: Patient Active Problem List   Diagnosis Date Noted   Pulmonary emphysema (HCC) 12/02/2019   IBS (irritable bowel syndrome) 06/07/2019   Osteoarthritis 01/01/2017   HTN (hypertension) 01/01/2017   Hypothyroidism 01/01/2017    History obtained from: chart  review and patient.  Kara Green is a 81 y.o. female presenting for a follow up visit.  She was last seen in May 2023.  At that time, she was continued on nasal Atrovent 1 spray per nostril daily.  For her asthma, her spirometry was stable.  We continue with Symbicort 80 mcg 2 puffs 1-2 times daily as well as albuterol as needed.  She has been limiting her Symbicort due to increased ocular pressure.  Since last visit, she has done well.   Asthma/Respiratory Symptom History: She remains on the Symbicort two puffs in the morning. She received a notification that the Symbicort is no longer going to be covered.  She is not very pleased with her insurance since Symbicort has been working so well. She does not want to ho too high on a steroid containing inhaler since she has some elevated intraocular pressure. She has not been using her rescue inhaler much at all.   Allergic Rhinitis Symptom History: She has been using the nasal ipratropium which seems to be working well. She otherwise does not use anything on a routine basis.   She reports that she had her flu shot in September. Two days late, she had achiness all over for one week. She has never had any problems with the flu shot like this in the past.  She has a history of scoliosis and undergoes physical therapy for this.  Otherwise, there have been no changes to her past  medical history, surgical history, family history, or social history.    Review of Systems  Constitutional: Negative.  Negative for chills, fever, malaise/fatigue and weight loss.  HENT: Negative.  Negative for congestion, ear discharge and ear pain.   Eyes:  Negative for pain, discharge and redness.  Respiratory:  Negative for cough, sputum production, shortness of breath and wheezing.   Cardiovascular: Negative.  Negative for chest pain and palpitations.  Gastrointestinal:  Negative for abdominal pain, constipation, diarrhea, heartburn, nausea and vomiting.  Skin: Negative.   Negative for itching and rash.  Neurological:  Negative for dizziness and headaches.  Endo/Heme/Allergies:  Negative for environmental allergies. Does not bruise/bleed easily.       Objective:   Blood pressure (!) 130/52, pulse 66, temperature 97.8 F (36.6 C), temperature source Temporal, resp. rate 16, height 4' 11.75" (1.518 m), weight 126 lb 12.8 oz (57.5 kg), SpO2 94 %. Body mass index is 24.97 kg/m.    Physical Exam Vitals reviewed.  Constitutional:      Appearance: She is well-developed.     Comments: Very pleasant.  HENT:     Head: Normocephalic and atraumatic.     Right Ear: Tympanic membrane, ear canal and external ear normal.     Left Ear: Tympanic membrane, ear canal and external ear normal.     Nose: Mucosal edema present. No nasal deformity, septal deviation or rhinorrhea.     Right Turbinates: Not enlarged or swollen.     Left Turbinates: Not enlarged or swollen.     Right Sinus: No maxillary sinus tenderness or frontal sinus tenderness.     Left Sinus: No maxillary sinus tenderness or frontal sinus tenderness.     Mouth/Throat:     Mouth: Mucous membranes are not pale and not dry.     Pharynx: Uvula midline.  Eyes:     General: Lids are normal. No allergic shiner.       Right eye: No discharge.        Left eye: No discharge.     Conjunctiva/sclera: Conjunctivae normal.     Right eye: Right conjunctiva is not injected. No chemosis.    Left eye: Left conjunctiva is not injected. No chemosis.    Pupils: Pupils are equal, round, and reactive to light.  Cardiovascular:     Rate and Rhythm: Normal rate and regular rhythm.     Heart sounds: Normal heart sounds.  Pulmonary:     Effort: Pulmonary effort is normal. No tachypnea, accessory muscle usage or respiratory distress.     Breath sounds: Normal breath sounds. No wheezing, rhonchi or rales.     Comments: No wheezes or crackles. Chest:     Chest wall: No tenderness.  Lymphadenopathy:     Cervical: No  cervical adenopathy.  Skin:    Coloration: Skin is not pale.     Findings: No abrasion, erythema, petechiae or rash. Rash is not papular, urticarial or vesicular.  Neurological:     Mental Status: She is alert.  Psychiatric:        Behavior: Behavior is cooperative.      Diagnostic studies:    Spirometry: results normal (FEV1: 0.90/65%, FVC: 1.37/72%, FEV1/FVC: 66%).    Spirometry consistent with mixed obstructive and restrictive disease. Overall this is very stable compared to previous spirometric findings.   Allergy Studies: none       Malachi Bonds, MD  [facility]

## 2022-04-07 ENCOUNTER — Encounter: Payer: Self-pay | Admitting: Allergy & Immunology

## 2022-04-22 DIAGNOSIS — H401113 Primary open-angle glaucoma, right eye, severe stage: Secondary | ICD-10-CM | POA: Diagnosis not present

## 2022-04-22 DIAGNOSIS — H401122 Primary open-angle glaucoma, left eye, moderate stage: Secondary | ICD-10-CM | POA: Diagnosis not present

## 2022-06-10 DIAGNOSIS — I10 Essential (primary) hypertension: Secondary | ICD-10-CM | POA: Diagnosis not present

## 2022-06-10 DIAGNOSIS — M4125 Other idiopathic scoliosis, thoracolumbar region: Secondary | ICD-10-CM | POA: Diagnosis not present

## 2022-06-10 DIAGNOSIS — Z0000 Encounter for general adult medical examination without abnormal findings: Secondary | ICD-10-CM | POA: Diagnosis not present

## 2022-06-10 DIAGNOSIS — M8589 Other specified disorders of bone density and structure, multiple sites: Secondary | ICD-10-CM | POA: Diagnosis not present

## 2022-06-10 DIAGNOSIS — L659 Nonscarring hair loss, unspecified: Secondary | ICD-10-CM | POA: Diagnosis not present

## 2022-06-10 DIAGNOSIS — E559 Vitamin D deficiency, unspecified: Secondary | ICD-10-CM | POA: Diagnosis not present

## 2022-06-10 DIAGNOSIS — H4089 Other specified glaucoma: Secondary | ICD-10-CM | POA: Diagnosis not present

## 2022-06-10 DIAGNOSIS — J302 Other seasonal allergic rhinitis: Secondary | ICD-10-CM | POA: Diagnosis not present

## 2022-06-10 DIAGNOSIS — M19049 Primary osteoarthritis, unspecified hand: Secondary | ICD-10-CM | POA: Diagnosis not present

## 2022-06-10 DIAGNOSIS — E7800 Pure hypercholesterolemia, unspecified: Secondary | ICD-10-CM | POA: Diagnosis not present

## 2022-06-10 DIAGNOSIS — E039 Hypothyroidism, unspecified: Secondary | ICD-10-CM | POA: Diagnosis not present

## 2022-06-10 DIAGNOSIS — J4489 Other specified chronic obstructive pulmonary disease: Secondary | ICD-10-CM | POA: Diagnosis not present

## 2022-07-25 DIAGNOSIS — H5213 Myopia, bilateral: Secondary | ICD-10-CM | POA: Diagnosis not present

## 2022-07-30 ENCOUNTER — Ambulatory Visit
Admission: EM | Admit: 2022-07-30 | Discharge: 2022-07-30 | Disposition: A | Discharge status: Home / Self Care | Payer: Medicare HMO | Attending: Family Medicine | Admitting: Family Medicine

## 2022-07-30 DIAGNOSIS — N3090 Cystitis, unspecified without hematuria: Secondary | ICD-10-CM | POA: Diagnosis not present

## 2022-07-30 LAB — POCT URINALYSIS DIP (MANUAL ENTRY)
Bilirubin, UA: NEGATIVE
Glucose, UA: NEGATIVE mg/dL
Ketones, POC UA: NEGATIVE mg/dL
Nitrite, UA: NEGATIVE
Protein Ur, POC: NEGATIVE mg/dL
Spec Grav, UA: 1.01 (ref 1.010–1.025)
Urobilinogen, UA: 0.2 E.U./dL
pH, UA: 7 (ref 5.0–8.0)

## 2022-07-30 MED ORDER — CEPHALEXIN 250 MG PO CAPS: 250.0000 mg | ORAL_CAPSULE | Freq: Three times a day (TID) | ORAL | 0 refills | Status: AC

## 2022-07-30 NOTE — Discharge Instructions (Addendum)
The urinalysis showed some white blood cells and some red blood cells.  This could be a sign of urinary tract infection  Take cephalexin 250 mg--1 capsule 3 times daily for 7 days  Urine culture is sent, and our staff will notify you if it looks like the antibiotic needs to be changed.

## 2022-07-30 NOTE — ED Provider Notes (Signed)
[facility]    CSN: [id number] Arrival date & time: 07/30/22  1407      History   Chief Complaint Chief Complaint  Patient presents with   UTI    HPI Kara Green is a 82 y.o. female.   HPI Here for pelvic pressure and discomfort when urinating.  Symptoms began on March 10.  She has not had any fever and no nausea or vomiting.  She has had some low back pain.  She is allergic to penicillin which causes a rash and she is allergic to sulfa and tetracyclines also.  She has tolerated Keflex in the past  Past Medical History:  Diagnosis Date   Allergy    Arthritis    Cataract    Glaucoma    Hypertension    IBS (irritable bowel syndrome)    Scoliosis 08/2021   Thyroid disease    UTI (urinary tract infection)     Patient Active Problem List   Diagnosis Date Noted   Pulmonary emphysema ([place]) 12/02/2019   IBS (irritable bowel syndrome) 06/07/2019   Osteoarthritis 01/01/2017   HTN (hypertension) 01/01/2017   Hypothyroidism 01/01/2017    Past Surgical History:  Procedure Laterality Date   ABDOMINAL HYSTERECTOMY  1981   total   APPENDECTOMY  1981   cataracts Bilateral    HERNIA REPAIR  1980   TONSILLECTOMY AND ADENOIDECTOMY      OB History   No obstetric history on file.      Home Medications    Prior to Admission medications   Medication Sig Start Date End Date Taking? Authorizing Provider  cephALEXin (KEFLEX) 250 MG capsule Take 1 capsule (250 mg total) by mouth 3 (three) times daily for 7 days. 07/30/22 08/06/22 Yes Jeshawn Melucci, Gwenlyn Perking, MD  albuterol (VENTOLIN HFA) 108 (90 Base) MCG/ACT inhaler Inhale 2 puffs into the lungs every 6 (six) hours as needed for wheezing or shortness of breath. Patient not taking: Reported on 04/04/2022 09/27/21   Valentina Shaggy, MD  amLODipine (NORVASC) 10 MG tablet TAKE 1 TABLET BY MOUTH EVERY DAY 12/04/20   Dutch Quint B, FNP  APPLE CIDER VINEGAR PO Take by mouth.    [provider]   apraclonidine (IOPIDINE) 0.5 % ophthalmic solution Place 1 drop into both eyes 2 (two) times daily.    [provider]  Ascorbic Acid (VITAMIN C) 100 MG tablet Take 100 mg by mouth daily.    [provider]  aspirin EC 81 MG tablet Take 81 mg by mouth daily.    [provider]  budesonide-formoterol (SYMBICORT) 80-4.5 MCG/ACT inhaler Inhale 2 puffs into the lungs 2 (two) times daily. With spacer in the morning and at bedtime. 09/27/21   Valentina Shaggy, MD  Cholecalciferol (VITAMIN D) 2000 units CAPS Take 1 capsule by mouth daily.    [provider]  Collagen 500 MG CAPS Take by mouth daily.    [provider]  FLUAD QUADRIVALENT 0.5 ML injection Inject 0.5 mLs into the muscle once. 02/05/22   [provider]  Garlic 10 MG CAPS Take by mouth daily.    [provider]  glucosamine-chondroitin 500-400 MG tablet Take 1 tablet by mouth daily.    [provider]  ipratropium (ATROVENT) 0.06 % nasal spray Place 2 sprays into both nostrils 3 (three) times daily. 09/27/21   Valentina Shaggy, MD  latanoprost (XALATAN) 0.005 % ophthalmic solution Place 1 drop into both eyes at bedtime.    [provider]  levothyroxine (SYNTHROID) 50 MCG tablet TAKE 1 TABLET BY MOUTH EVERY DAY 12/04/20   Dutch Quint B, FNP  loratadine (KLS ALLERCLEAR) 10 MG tablet Take 10 mg by mouth daily.     [provider]  Magnesium 400 MG CAPS Take by mouth daily.    [provider]  Omega-3 Fatty Acids (FISH OIL) 1200 MG CAPS Take by mouth daily.    [provider]  Prenatal Vit-Fe Fumarate-FA (PRENATAL MULTIVITAMIN) TABS tablet Take 1 tablet by mouth daily at 12 noon.    [provider]  spironolactone (ALDACTONE) 25 MG tablet TAKE 2 TABLETS BY MOUTH EVERY DAY 12/08/20   Dutch Quint B, FNP  timolol (TIMOPTIC) 0.5 % ophthalmic solution SMARTSIG:In Eye(s) 05/09/20   [provider]  triamcinolone  cream (KENALOG) 0.1 % Apply 1 application topically 2 (two) times daily. 08/27/18   Scot Jun, NP  TURMERIC PO Take 720 mg by mouth daily.    [provider]  vitamin E 100 UNIT capsule Take by mouth daily.    [provider]    Family History Family History  Problem Relation Age of Onset   Arthritis Mother    Breast cancer Mother        in 33's   Stroke Mother    Hypertension Mother    Hypertension Father    Diabetes Daughter    Diabetes Son    Arthritis Maternal Grandmother    Arthritis Maternal Grandfather    Colon cancer Neg Hx    Esophageal cancer Neg Hx    Inflammatory bowel disease Neg Hx    Liver disease Neg Hx    Pancreatic cancer Neg Hx    Rectal cancer Neg Hx    Stomach cancer Neg Hx     Social History Social History   Tobacco Use   Smoking status: Former   Smokeless tobacco: Never   Tobacco comments:    quit 25 years ago  Vaping Use   Vaping Use: Never used  Substance Use Topics   Alcohol use: Yes    Alcohol/week: 7.0 standard drinks of alcohol    Types: 7 Glasses of wine per week    Comment: with dinner   Drug use: No     Allergies   Acetazolamide, Benadryl [diphenhydramine hcl], Codeine, Neurontin [gabapentin], Penicillins, Sulfa antibiotics, Decongestant [oxymetazoline], and Tetracyclines & related   Review of Systems Review of Systems   Physical Exam Triage Vital Signs ED Triage Vitals  Enc Vitals Group     BP 07/30/22 1538 (!) 157/81     Pulse Rate 07/30/22 1538 64     Resp 07/30/22 1538 17     Temp 07/30/22 1538 98.1 F (36.7 C)     Temp Source 07/30/22 1538 Oral     SpO2 07/30/22 1538 92 %     Weight --      Height --      Head Circumference --      Peak Flow --      Pain Score 07/30/22 1540 3     Pain Loc --      Pain Edu? --      Excl. in [place]? --    No data found.  Updated Vital Signs BP (!) 157/81 (BP Location: Right Arm)   Pulse 64   Temp 98.1 F (36.7 C) (Oral)   Resp 17   SpO2 92%    Visual Acuity Right Eye Distance:   Left Eye Distance:  Bilateral Distance:    Right Eye Near:   Left Eye Near:    Bilateral Near:     Physical Exam Vitals reviewed.  Constitutional:      General: She is not in acute distress.    Appearance: She is not ill-appearing, toxic-appearing or diaphoretic.  HENT:     Mouth/Throat:     Mouth: Mucous membranes are moist.  Cardiovascular:     Rate and Rhythm: Normal rate and regular rhythm.  Pulmonary:     Effort: Pulmonary effort is normal.     Breath sounds: Normal breath sounds.  Abdominal:     Palpations: Abdomen is soft.     Tenderness: There is abdominal tenderness (suprapubic).  Skin:    Coloration: Skin is not pale.  Neurological:     General: No focal deficit present.     Mental Status: She is alert and oriented to person, place, and time.  Psychiatric:        Behavior: Behavior normal.      UC Treatments / Results  Labs (all labs ordered are listed, but only abnormal results are displayed) Labs Reviewed  POCT URINALYSIS DIP (MANUAL ENTRY) - Abnormal; Notable for the following components:      Result Value   Color, UA colorless (*)    Blood, UA trace-intact (*)    Leukocytes, UA Small (1+) (*)    All other components within normal limits  URINE CULTURE    EKG   Radiology No results found.  Procedures Procedures (including critical care time)  Medications Ordered in UC Medications - No data to display  Initial Impression / Assessment and Plan / UC Course  I have reviewed the triage vital signs and the nursing notes.  Pertinent labs & imaging results that were available during my care of the patient were reviewed by me and considered in my medical decision making (see chart for details).       Urinalysis shows a small amount of leukocytes and a trace of blood.  Cephalexin is sent in to treat UTI and urine culture is sent.  Will notify her if it looks like the antibiotic needs to be  changed.  Final Clinical Impressions(s) / UC Diagnoses   Final diagnoses:  Cystitis     Discharge Instructions      The urinalysis showed some white blood cells and some red blood cells.  This could be a sign of urinary tract infection  Take cephalexin 250 mg--1 capsule 3 times daily for 7 days  Urine culture is sent, and our staff will notify you if it looks like the antibiotic needs to be changed.       ED Prescriptions     Medication Sig Dispense Auth. Provider   cephALEXin (KEFLEX) 250 MG capsule Take 1 capsule (250 mg total) by mouth 3 (three) times daily for 7 days. 21 capsule Barrett Henle, MD      PDMP not reviewed this encounter.   Barrett Henle, MD 07/30/22 [phone]

## 2022-07-30 NOTE — ED Triage Notes (Signed)
Pt presents with discomfort during urination and lower abdominal pressure X 2 days.

## 2022-08-01 LAB — URINE CULTURE: Culture: 50000 — AB

## 2022-08-06 ENCOUNTER — Ambulatory Visit: Payer: Medicare HMO | Admitting: Podiatry

## 2022-08-06 DIAGNOSIS — L603 Nail dystrophy: Secondary | ICD-10-CM | POA: Diagnosis not present

## 2022-08-06 DIAGNOSIS — L600 Ingrowing nail: Secondary | ICD-10-CM | POA: Diagnosis not present

## 2022-08-06 DIAGNOSIS — L84 Corns and callosities: Secondary | ICD-10-CM

## 2022-08-06 NOTE — Progress Notes (Signed)
Subjective: Chief Complaint  Patient presents with   Callouses    Callouse on right foot, near great hallux    82 year old female presents the office with above concerns.  She states that she has been using the urea nail gel on the second toe which has been helping but she cannot get ingrown at times causing discomfort.  She gets a toe spacer between the first and second toe.  She also gets a callus on the ball of her right foot.  Not been as painful has been previously.  No open sores.  No swelling redness or drainage.  No other concerns.  Objective: AAO x3, NAD DP/PT pulses palpable bilaterally, CRT less than 3 seconds Bunions present.  There is prominent metatarsal heads plantarly with atrophy the fat pad.  Minimal callus formation present on the plantar aspect the left foot and more so to the right foot without any underlying ulceration drainage or signs of infection.  Right second nails mildly hypertrophic, dystrophic and incurvated.  Mild discomfort distal aspect.  No edema, erythema or signs of infection. No pain with calf compression, swelling, warmth, erythema  Assessment: Right foot callus due to prominent metatarsal head; ingrown toenail/onychodystrophy right second toenail  Plan: -All treatment options discussed with the patient including all alternatives, risks, complications.  -As a courtesy debride the callus without any complications or bleeding is minimal today.  Continue offloading, moisturizer. -Debrided the right second nail any complications or bleeding.  Nail seems to be getting better.  Consider partial nail avulsion if needed but currently minimal discomfort no signs of infection.  Continue offloading. -Patient encouraged to call the office with any questions, concerns, change in symptoms.   Trula Slade DPM

## 2022-09-30 DIAGNOSIS — H401122 Primary open-angle glaucoma, left eye, moderate stage: Secondary | ICD-10-CM | POA: Diagnosis not present

## 2022-09-30 DIAGNOSIS — H401113 Primary open-angle glaucoma, right eye, severe stage: Secondary | ICD-10-CM | POA: Diagnosis not present

## 2022-10-03 ENCOUNTER — Encounter: Payer: Self-pay | Admitting: Allergy & Immunology

## 2022-10-03 ENCOUNTER — Ambulatory Visit: Payer: Medicare HMO | Admitting: Allergy & Immunology

## 2022-10-03 ENCOUNTER — Other Ambulatory Visit: Payer: Self-pay

## 2022-10-03 VITALS — BP 118/70 | HR 66 | Temp 97.7°F | Resp 16 | Ht 59.0 in | Wt 126.4 lb

## 2022-10-03 DIAGNOSIS — J4489 Other specified chronic obstructive pulmonary disease: Secondary | ICD-10-CM | POA: Diagnosis not present

## 2022-10-03 MED ORDER — IPRATROPIUM BROMIDE 0.06 % NA SOLN: 2.0000 | Freq: Every morning | NASAL | 3 refills | Status: DC

## 2022-10-03 NOTE — Patient Instructions (Addendum)
1. Perennial allergic rhinitis (indoor mold mix) - Continue with nasal ipratropium one spray per nostril daily (can increase to twice daily on bad days). - It seems like you have everything under excellent control.   2. 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  - Let's hold off on the maintenance medications for now since you have gotten the mold taken care of.  - Daily controller medication(s): NOTHING - Prior to physical activity: albuterol 2 puffs 10-15 minutes before physical activity. - Rescue medications: albuterol 4 puffs every 4-6 hours as needed - Changes during respiratory infections or worsening symptoms: Add on Symbicort to 2 puffs twice daily for TWO WEEKS. - Asthma control goals:  * Full participation in all desired activities (may need albuterol before activity) * Albuterol use two time or less a week on average (not counting use with activity) * Cough interfering with sleep two time or less a month * Oral steroids no more than once a year * No hospitalizations  3. Follow up as needed.    Please inform us of any Emergency Department visits, hospitalizations, or changes in symptoms. Call us before going to the ED for breathing or allergy symptoms since we might be able to fit you in for a sick visit. Feel free to contact us anytime with any questions, problems, or concerns.  It was a pleasure to see you again today!  Websites that have reliable patient information: 1. American Academy of Asthma, Allergy, and Immunology: www.aaaai.org 2. Food Allergy Research and Education (FARE): foodallergy.org 3. Mothers of Asthmatics: http://www.asthmacommunitynetwork.org 4. American College of Allergy, Asthma, and Immunology: www.acaai.org   COVID-19 Vaccine Information can be found at: [email or web address] For questions related to vaccine distribution or appointments, please email [email or web address]  or call [phone].   We realize that you might be concerned about having an allergic reaction to the COVID19 vaccines. To help with that concern, WE ARE OFFERING THE COVID19 VACCINES IN OUR OFFICE! Ask the front desk for dates!     "Like" [place] on Facebook and Instagram for our latest updates!      A healthy democracy works best when [employer] participate! Make sure you are registered to vote! If you have moved or changed any of your contact information, you will need to get this updated before voting!  In some cases, you MAY be able to register to vote online: [email or web address]

## 2022-10-03 NOTE — Progress Notes (Signed)
FOLLOW UP  Date of Service/Encounter:  10/07/22   Assessment:   Perennial allergic rhinitis - doing very well with the nasal Atrovent   Asthma-COPD overlap syndrome - GOLD A    Eczema - controlled with triamcinolone as needed    Plan/Recommendations:   1. Perennial allergic rhinitis (indoor mold mix) - Continue with nasal ipratropium one spray per nostril daily (can increase to twice daily on bad days). - It seems like you have everything under excellent control.   2. 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  - Let's hold off on the maintenance medications for now since you have gotten the mold taken care of.  - Daily controller medication(s): NOTHING - Prior to physical activity: albuterol 2 puffs 10-15 minutes before physical activity. - Rescue medications: albuterol 4 puffs every 4-6 hours as needed - Changes during respiratory infections or worsening symptoms: Add on Symbicort to 2 puffs twice daily for TWO WEEKS. - Asthma control goals:  * Full participation in all desired activities (may need albuterol before activity) * Albuterol use two time or less a week on average (not counting use with activity) * Cough interfering with sleep two time or less a month * Oral steroids no more than once a year * No hospitalizations  3. Follow up as needed.   Subjective:   Kara Green is a 82 y.o. female presenting today for follow up of  Chief Complaint  Patient presents with   Asthma    Kara Green has a history of the following: Patient Active Problem List   Diagnosis Date Noted   Pulmonary emphysema ([facility]) 12/02/2019   IBS (irritable bowel syndrome) 06/07/2019   Osteoarthritis 01/01/2017   HTN (hypertension) 01/01/2017   Hypothyroidism 01/01/2017    History obtained from: chart review and patient.  Kara Green is a 82 y.o. female presenting for a follow up visit. She was last seen November 2023.  At that time, she was doing well  on ipratropium as needed.  For her asthma, her lung testing looks stable.  We changed her from Symbicort to [facility] to make it easier for her.  We also continue with albuterol as needed.  Since last visit, she has done well.   Asthma/Respiratory Symptom History: She has not noted any difference in her symptoms. She is not running marathons. She thinks that a lot of this is related to her discovering that there was mold in her home. Evidently her contractor put insulation under the floor and the water heater leaked under the house. This led to the development of mold around the water heater and behind the washer. This was all fixed. She has not used her albuterol at all.   Her son-in-law did the repair work so she did not pay anything at all.   Allergic Rhinitis Symptom History: This is working  with the use of the ipratropium. It was around $5 a bottle initially, but then she got 3 bottles without a charge. She has not needed antibiotics. She is very happy with how she is doing and does not think that she needs alleryg shots.   Otherwise, there have been no changes to her past medical history, surgical history, family history, or social history.    Review of Systems  Constitutional: Negative.  Negative for chills, fever, malaise/fatigue and weight loss.  HENT: Negative.  Negative for congestion, ear discharge and ear pain.   Eyes:  Negative for pain, discharge and redness.  Respiratory:  Negative for cough,  sputum production, shortness of breath and wheezing.   Cardiovascular: Negative.  Negative for chest pain and palpitations.  Gastrointestinal:  Negative for abdominal pain, constipation, diarrhea, heartburn, nausea and vomiting.  Skin: Negative.  Negative for itching and rash.  Neurological:  Negative for dizziness and headaches.  Endo/Heme/Allergies:  Negative for environmental allergies. Does not bruise/bleed easily.       Objective:   Blood pressure 118/70, pulse 66, temperature 97.7  F (36.5 C), resp. rate 16, height 4\' 11"  (1.499 m), weight 126 lb 6.4 oz (57.3 kg), SpO2 94 %. Body mass index is 25.53 kg/m.    Physical Exam Vitals reviewed.  Constitutional:      Appearance: She is well-developed.     Comments: Very pleasant. Talkative.   HENT:     Head: Normocephalic and atraumatic.     Right Ear: Tympanic membrane, ear canal and external ear normal.     Left Ear: Tympanic membrane, ear canal and external ear normal.     Nose: Mucosal edema present. No nasal deformity, septal deviation or rhinorrhea.     Right Turbinates: Enlarged, swollen and pale.     Left Turbinates: Enlarged, swollen and pale.     Right Sinus: No maxillary sinus tenderness or frontal sinus tenderness.     Left Sinus: No maxillary sinus tenderness or frontal sinus tenderness.     Comments: No nasal polyps noted.     Mouth/Throat:     Mouth: Mucous membranes are not pale and not dry.     Pharynx: Uvula midline.  Eyes:     General: Lids are normal. No allergic shiner.       Right eye: No discharge.        Left eye: No discharge.     Conjunctiva/sclera: Conjunctivae normal.     Right eye: Right conjunctiva is not injected. No chemosis.    Left eye: Left conjunctiva is not injected. No chemosis.    Pupils: Pupils are equal, round, and reactive to light.  Cardiovascular:     Rate and Rhythm: Normal rate and regular rhythm.     Heart sounds: Normal heart sounds.  Pulmonary:     Effort: Pulmonary effort is normal. No tachypnea, accessory muscle usage or respiratory distress.     Breath sounds: Normal breath sounds. No wheezing, rhonchi or rales.     Comments: No wheezes or crackles. Chest:     Chest wall: No tenderness.  Lymphadenopathy:     Cervical: No cervical adenopathy.  Skin:    Coloration: Skin is not pale.     Findings: No abrasion, erythema, petechiae or rash. Rash is not papular, urticarial or vesicular.  Neurological:     Mental Status: She is alert.  Psychiatric:         Behavior: Behavior is cooperative.      Diagnostic studies: none      Malachi Bonds, MD  [facility]

## 2022-10-07 ENCOUNTER — Encounter: Payer: Self-pay | Admitting: Allergy & Immunology

## 2022-10-10 ENCOUNTER — Other Ambulatory Visit: Payer: Self-pay | Admitting: Family Medicine

## 2022-10-10 ENCOUNTER — Ambulatory Visit
Admission: RE | Admit: 2022-10-10 | Discharge: 2022-10-10 | Disposition: A | Discharge status: Home / Self Care | Payer: Medicare HMO | Source: Ambulatory Visit | Attending: Family Medicine | Admitting: Family Medicine

## 2022-10-10 DIAGNOSIS — R0781 Pleurodynia: Secondary | ICD-10-CM | POA: Diagnosis not present

## 2022-10-10 DIAGNOSIS — M549 Dorsalgia, unspecified: Secondary | ICD-10-CM | POA: Diagnosis not present

## 2022-10-10 DIAGNOSIS — W19XXXD Unspecified fall, subsequent encounter: Secondary | ICD-10-CM | POA: Diagnosis not present

## 2022-10-16 DIAGNOSIS — M545 Low back pain, unspecified: Secondary | ICD-10-CM | POA: Diagnosis not present

## 2022-10-22 DIAGNOSIS — M545 Low back pain, unspecified: Secondary | ICD-10-CM | POA: Diagnosis not present

## 2022-10-31 DIAGNOSIS — M810 Age-related osteoporosis without current pathological fracture: Secondary | ICD-10-CM | POA: Diagnosis not present

## 2022-10-31 DIAGNOSIS — E559 Vitamin D deficiency, unspecified: Secondary | ICD-10-CM | POA: Diagnosis not present

## 2022-10-31 DIAGNOSIS — S22080A Wedge compression fracture of T11-T12 vertebra, initial encounter for closed fracture: Secondary | ICD-10-CM | POA: Diagnosis not present

## 2022-10-31 DIAGNOSIS — R5383 Other fatigue: Secondary | ICD-10-CM | POA: Diagnosis not present

## 2022-10-31 DIAGNOSIS — S32000A Wedge compression fracture of unspecified lumbar vertebra, initial encounter for closed fracture: Secondary | ICD-10-CM | POA: Diagnosis not present

## 2022-11-12 DIAGNOSIS — M810 Age-related osteoporosis without current pathological fracture: Secondary | ICD-10-CM | POA: Diagnosis not present

## 2022-11-12 DIAGNOSIS — E349 Endocrine disorder, unspecified: Secondary | ICD-10-CM | POA: Diagnosis not present

## 2022-11-19 DIAGNOSIS — M47816 Spondylosis without myelopathy or radiculopathy, lumbar region: Secondary | ICD-10-CM | POA: Diagnosis not present

## 2022-11-19 DIAGNOSIS — M810 Age-related osteoporosis without current pathological fracture: Secondary | ICD-10-CM | POA: Diagnosis not present

## 2022-11-19 DIAGNOSIS — S22080D Wedge compression fracture of T11-T12 vertebra, subsequent encounter for fracture with routine healing: Secondary | ICD-10-CM | POA: Diagnosis not present

## 2022-11-20 ENCOUNTER — Other Ambulatory Visit: Payer: Self-pay

## 2022-11-20 DIAGNOSIS — M810 Age-related osteoporosis without current pathological fracture: Secondary | ICD-10-CM | POA: Insufficient documentation

## 2022-11-29 ENCOUNTER — Ambulatory Visit
Admission: EM | Admit: 2022-11-29 | Discharge: 2022-11-29 | Disposition: A | Discharge status: Home / Self Care | Payer: 59 | Attending: Internal Medicine | Admitting: Internal Medicine

## 2022-11-29 ENCOUNTER — Other Ambulatory Visit: Payer: Self-pay

## 2022-11-29 DIAGNOSIS — T7840XA Allergy, unspecified, initial encounter: Secondary | ICD-10-CM

## 2022-11-29 DIAGNOSIS — L03115 Cellulitis of right lower limb: Secondary | ICD-10-CM

## 2022-11-29 DIAGNOSIS — T63441A Toxic effect of venom of bees, accidental (unintentional), initial encounter: Secondary | ICD-10-CM | POA: Diagnosis not present

## 2022-11-29 MED ORDER — FAMOTIDINE 20 MG PO TABS: 20.0000 mg | ORAL_TABLET | Freq: Every day | ORAL | 0 refills | Status: AC

## 2022-11-29 MED ORDER — CEPHALEXIN 500 MG PO CAPS: 500.0000 mg | ORAL_CAPSULE | Freq: Three times a day (TID) | ORAL | 0 refills | Status: AC

## 2022-11-29 MED ORDER — METHYLPREDNISOLONE ACETATE 80 MG/ML IJ SUSP
80.0000 mg | Freq: Once | INTRAMUSCULAR | Status: AC
  Administered 2022-11-29: 80 mg via INTRAMUSCULAR

## 2022-11-29 NOTE — ED Provider Notes (Signed)
[facility]    CSN: [id number] Arrival date & time: 11/29/22  1635      History   Chief Complaint Chief Complaint  Patient presents with   Insect Bite    Japanese Hornet (Sting)    HPI Althia Fray is a 82 y.o. female.   Patient presents with concern of right lower ankle swelling that started after she was stung by a [place] bee approximately 3 days ago.  Reports pain to the area that radiates up her leg as well as drainage that started today.  Denies any fever, body aches, chills.  Denies feelings of throat closing or shortness of breath.  Reports that she takes Zyrtec daily but is not able to take Benadryl.     Past Medical History:  Diagnosis Date   Allergy    Arthritis    Cataract    Glaucoma    Hypertension    IBS (irritable bowel syndrome)    Scoliosis 08/2021   Thyroid disease    UTI (urinary tract infection)     Patient Active Problem List   Diagnosis Date Noted   Senile osteoporosis 11/20/2022   Pulmonary emphysema (HCC) 12/02/2019   IBS (irritable bowel syndrome) 06/07/2019   Osteoarthritis 01/01/2017   HTN (hypertension) 01/01/2017   Hypothyroidism 01/01/2017    Past Surgical History:  Procedure Laterality Date   ABDOMINAL HYSTERECTOMY  1981   total   APPENDECTOMY  1981   cataracts Bilateral    HERNIA REPAIR  1980   TONSILLECTOMY AND ADENOIDECTOMY      OB History   No obstetric history on file.      Home Medications    Prior to Admission medications   Medication Sig Start Date End Date Taking? Authorizing Provider  amLODipine (NORVASC) 10 MG tablet TAKE 1 TABLET BY MOUTH EVERY DAY 12/04/20  Yes Worthy Rancher B, FNP  APPLE CIDER VINEGAR PO Take by mouth.   Yes [provider]  apraclonidine (IOPIDINE) 0.5 % ophthalmic solution Place 1 drop into both eyes 2 (two) times daily.   Yes [provider]  Ascorbic Acid (VITAMIN C) 100 MG tablet Take 100 mg by mouth daily.   Yes [provider]   aspirin EC 81 MG tablet Take 81 mg by mouth daily.   Yes [provider]  cephALEXin (KEFLEX) 500 MG capsule Take 1 capsule (500 mg total) by mouth 3 (three) times daily for 5 days. 11/29/22 12/04/22 Yes Husayn Reim, Acie Fredrickson, FNP  Cholecalciferol (VITAMIN D) 2000 units CAPS Take 1 capsule by mouth daily.   Yes [provider]  Collagen 500 MG CAPS Take by mouth daily.   Yes [provider]  famotidine (PEPCID) 20 MG tablet Take 1 tablet (20 mg total) by mouth daily. 11/29/22  Yes Hadlei Stitt, Rolly Salter E, FNP  Garlic 10 MG CAPS Take by mouth daily.   Yes [provider]  glucosamine-chondroitin 500-400 MG tablet Take 1 tablet by mouth daily.   Yes [provider]  ipratropium (ATROVENT) 0.06 % nasal spray Place 2 sprays into both nostrils in the morning. 10/03/22 01/01/23 Yes Alfonse Spruce, MD  latanoprost (XALATAN) 0.005 % ophthalmic solution Place 1 drop into both eyes at bedtime.   Yes [provider]  levothyroxine (SYNTHROID) 50 MCG tablet TAKE 1 TABLET BY MOUTH EVERY DAY 12/04/20  Yes Worthy Rancher B, FNP  loratadine (KLS ALLERCLEAR) 10 MG tablet Take 10 mg by mouth daily.    Yes [provider]  Magnesium 400  MG CAPS Take by mouth daily.   Yes [provider]  Omega-3 Fatty Acids (FISH OIL) 1200 MG CAPS Take by mouth daily.   Yes [provider]  Prenatal Vit-Fe Fumarate-FA (PRENATAL MULTIVITAMIN) TABS tablet Take 1 tablet by mouth daily at 12 noon.   Yes [provider]  spironolactone (ALDACTONE) 25 MG tablet TAKE 2 TABLETS BY MOUTH EVERY DAY 12/08/20  Yes Worthy Rancher B, FNP  timolol (TIMOPTIC) 0.5 % ophthalmic solution SMARTSIG:In Eye(s) 05/09/20  Yes [provider]  triamcinolone cream (KENALOG) 0.1 % Apply 1 application topically 2 (two) times daily. 08/27/18  Yes Bing Neighbors, NP  TURMERIC PO Take 720 mg by mouth daily.   Yes [provider]  vitamin E 100 UNIT capsule Take by mouth  daily.   Yes [provider]  FLUAD QUADRIVALENT 0.5 ML injection Inject 0.5 mLs into the muscle once. 02/05/22   [provider]    Family History Family History  Problem Relation Age of Onset   Arthritis Mother    Breast cancer Mother        in 20's   Stroke Mother    Hypertension Mother    Hypertension Father    Diabetes Daughter    Diabetes Son    Arthritis Maternal Grandmother    Arthritis Maternal Grandfather    Colon cancer Neg Hx    Esophageal cancer Neg Hx    Inflammatory bowel disease Neg Hx    Liver disease Neg Hx    Pancreatic cancer Neg Hx    Rectal cancer Neg Hx    Stomach cancer Neg Hx     Social History Social History   Tobacco Use   Smoking status: Former   Smokeless tobacco: Never   Tobacco comments:    quit 25 years ago  Vaping Use   Vaping status: Never Used  Substance Use Topics   Alcohol use: Yes    Alcohol/week: 7.0 standard drinks of alcohol    Types: 7 Glasses of wine per week    Comment: with dinner   Drug use: No     Allergies   Acetazolamide, Benadryl [diphenhydramine hcl], Codeine, Neurontin [gabapentin], Penicillins, Sulfa antibiotics, Decongestant [oxymetazoline], and Tetracyclines & related   Review of Systems Review of Systems Per HPI  Physical Exam Triage Vital Signs ED Triage Vitals  Encounter Vitals Group     BP 11/29/22 1654 (!) 149/73     Systolic BP Percentile --      Diastolic BP Percentile --      Pulse Rate 11/29/22 1654 71     Resp 11/29/22 1654 18     Temp 11/29/22 1654 98 F (36.7 C)     Temp Source 11/29/22 1654 Oral     SpO2 11/29/22 1654 94 %     Weight 11/29/22 1652 126 lb 5.2 oz (57.3 kg)     Height 11/29/22 1652 4\' 11"  (1.499 m)     Head Circumference --      Peak Flow --      Pain Score 11/29/22 1651 8     Pain Loc --      Pain Education --      Exclude from Growth Chart --    No data found.  Updated Vital Signs BP 138/84 (BP Location: Left Arm)   Pulse 71   Temp 98 F  (36.7 C) (Oral)   Resp 18   Ht 4\' 11"  (1.499 m)   Wt 126 lb 5.2 oz (  57.3 kg)   SpO2 94%   BMI 25.51 kg/m   Visual Acuity Right Eye Distance:   Left Eye Distance:   Bilateral Distance:    Right Eye Near:   Left Eye Near:    Bilateral Near:     Physical Exam Constitutional:      General: She is not in acute distress.    Appearance: Normal appearance. She is not toxic-appearing or diaphoretic.  HENT:     Head: Normocephalic and atraumatic.  Eyes:     Extraocular Movements: Extraocular movements intact.     Conjunctiva/sclera: Conjunctivae normal.  Pulmonary:     Effort: Pulmonary effort is normal.  Skin:    Comments: Patient has significant swelling present to right lower ankle/lower leg that is circumferential.  Erythema noted as well.  Patient does have little bit of purulent drainage present to the medial ankle.  Capillary refill and pulses intact.  No swelling to the foot.  Patient can wiggle toes.  Neurological:     General: No focal deficit present.     Mental Status: She is alert and oriented to person, place, and time. Mental status is at baseline.  Psychiatric:        Mood and Affect: Mood normal.        Behavior: Behavior normal.        Thought Content: Thought content normal.        Judgment: Judgment normal.      UC Treatments / Results  Labs (all labs ordered are listed, but only abnormal results are displayed) Labs Reviewed - No data to display  EKG   Radiology No results found.  Procedures Procedures (including critical care time)  Medications Ordered in UC Medications  methylPREDNISolone acetate (DEPO-MEDROL) injection 80 mg (80 mg Intramuscular Given 11/29/22 1718)    Initial Impression / Assessment and Plan / UC Course  I have reviewed the triage vital signs and the nursing notes.  Pertinent labs & imaging results that were available during my care of the patient were reviewed by me and considered in my medical decision making (see chart  for details).     Patient here for localized reaction to bee sting.  No signs of anaphylaxis on exam.  Given significant amount of swelling, do think IM steroid would be beneficial.  Spoke with Dr. Leonides Grills, supervising physician, about this given patient has glaucoma.  Per Dr. Leonides Grills, 1 dose should be safe.  Discussed risk with steroids and patient's associated glaucoma.  Patient voiced understanding of this.  Patient already taking cetirizine and reports that she is not able to take Benadryl.  Will add Pepcid daily given latest creatinine clearance was 50.  Cephalexin also prescribed given concern of possible cellulitis related to reaction given physical exam.  Patient reports that she has taken cephalexin previously and tolerated well despite penicillin allergy.  Advised monitoring closely for worsening symptoms and following up if they occur.  Patient verbalized understanding and was agreeable with plan. Final Clinical Impressions(s) / UC Diagnoses   Final diagnoses:  Bee sting, accidental or unintentional, initial encounter  Allergic reaction, initial encounter  Cellulitis of leg, right     Discharge Instructions      I have prescribed an antibiotic and Pepcid to help alleviate any bacterial infection of the skin and allergic reaction.  You were also given a steroid shot today to help alleviate allergic reaction.  Monitor closely and follow-up with any persistent symptoms.    ED Prescriptions  Medication Sig Dispense Auth. Provider   famotidine (PEPCID) 20 MG tablet Take 1 tablet (20 mg total) by mouth daily. 30 tablet [place], [place] E, [place]   cephALEXin (KEFLEX) 500 MG capsule Take 1 capsule (500 mg total) by mouth 3 (three) times daily for 5 days. 15 capsule [place], Acie Fredrickson, [place]      PDMP not reviewed this encounter.   Gustavus Bryant, [place] 11/29/22 1726

## 2022-11-29 NOTE — ED Triage Notes (Signed)
Possible insect bite on right lower leg "inside ankle". No fever. Swelling+. Redness+.

## 2022-11-29 NOTE — Discharge Instructions (Signed)
I have prescribed an antibiotic and Pepcid to help alleviate any bacterial infection of the skin and allergic reaction.  You were also given a steroid shot today to help alleviate allergic reaction.  Monitor closely and follow-up with any persistent symptoms.

## 2022-12-05 ENCOUNTER — Telehealth: Payer: Self-pay | Admitting: Pharmacy Technician

## 2022-12-05 NOTE — Telephone Encounter (Signed)
Auth Submission: NO AUTH NEEDED Site of care: Site of care: [facility] Payer: [employer] Medication & CPT/J Code(s) submitted: Reclast (Zolendronic acid) [id number] Route of submission (phone, fax, portal):  Phone # Fax # Auth type: Buy/Bill Units/visits requested: 1 Reference number:  Approval from: 12/05/22 to 05/20/23

## 2022-12-09 ENCOUNTER — Ambulatory Visit (INDEPENDENT_AMBULATORY_CARE_PROVIDER_SITE_OTHER): Payer: Medicare HMO

## 2022-12-09 VITALS — BP 134/71 | HR 57 | Temp 98.1°F | Resp 16 | Ht 59.0 in | Wt 120.0 lb

## 2022-12-09 DIAGNOSIS — M810 Age-related osteoporosis without current pathological fracture: Secondary | ICD-10-CM

## 2022-12-09 MED ORDER — DIPHENHYDRAMINE HCL 25 MG PO CAPS: 25.0000 mg | ORAL_CAPSULE | Freq: Once | ORAL | Status: DC

## 2022-12-09 MED ORDER — SODIUM CHLORIDE 0.9 % IV SOLN: INTRAVENOUS | Status: DC

## 2022-12-09 MED ORDER — ACETAMINOPHEN 325 MG PO TABS: 650.0000 mg | ORAL_TABLET | Freq: Once | ORAL | Status: DC

## 2022-12-09 MED ORDER — ZOLEDRONIC ACID 5 MG/100ML IV SOLN
5.0000 mg | Freq: Once | INTRAVENOUS | Status: AC
  Administered 2022-12-09: 5 mg via INTRAVENOUS
  Filled 2022-12-09: qty 100

## 2022-12-09 NOTE — Progress Notes (Signed)
Diagnosis: Osteoporosis  Provider:  Chilton Greathouse MD  Procedure: IV Infusion  IV Type: Peripheral, IV Location: R Antecubital  Reclast (Zolendronic Acid), Dose: 5 mg  Infusion Start Time: 1248  Infusion Stop Time: 1319  Post Infusion IV Care: Observation period completed and Peripheral IV Discontinued  Discharge: Condition: Good, Destination: Home . AVS Provided  Performed by:  Garnette Czech, RN

## 2022-12-09 NOTE — Patient Instructions (Signed)

## 2022-12-17 DIAGNOSIS — R5383 Other fatigue: Secondary | ICD-10-CM | POA: Diagnosis not present

## 2022-12-17 DIAGNOSIS — S22080D Wedge compression fracture of T11-T12 vertebra, subsequent encounter for fracture with routine healing: Secondary | ICD-10-CM | POA: Diagnosis not present

## 2022-12-17 DIAGNOSIS — M810 Age-related osteoporosis without current pathological fracture: Secondary | ICD-10-CM | POA: Diagnosis not present

## 2023-02-06 ENCOUNTER — Encounter: Payer: Self-pay | Admitting: Sports Medicine

## 2023-02-18 DIAGNOSIS — M47816 Spondylosis without myelopathy or radiculopathy, lumbar region: Secondary | ICD-10-CM | POA: Diagnosis not present

## 2023-02-18 DIAGNOSIS — M810 Age-related osteoporosis without current pathological fracture: Secondary | ICD-10-CM | POA: Diagnosis not present

## 2023-03-03 DIAGNOSIS — H401113 Primary open-angle glaucoma, right eye, severe stage: Secondary | ICD-10-CM | POA: Diagnosis not present

## 2023-03-03 DIAGNOSIS — H401122 Primary open-angle glaucoma, left eye, moderate stage: Secondary | ICD-10-CM | POA: Diagnosis not present

## 2023-03-13 DIAGNOSIS — L2989 Other pruritus: Secondary | ICD-10-CM | POA: Diagnosis not present

## 2023-03-13 DIAGNOSIS — L538 Other specified erythematous conditions: Secondary | ICD-10-CM | POA: Diagnosis not present

## 2023-03-13 DIAGNOSIS — L218 Other seborrheic dermatitis: Secondary | ICD-10-CM | POA: Diagnosis not present

## 2023-03-13 DIAGNOSIS — L820 Inflamed seborrheic keratosis: Secondary | ICD-10-CM | POA: Diagnosis not present

## 2023-03-13 DIAGNOSIS — L821 Other seborrheic keratosis: Secondary | ICD-10-CM | POA: Diagnosis not present

## 2023-03-13 DIAGNOSIS — S80861A Insect bite (nonvenomous), right lower leg, initial encounter: Secondary | ICD-10-CM | POA: Diagnosis not present

## 2023-03-13 DIAGNOSIS — D1801 Hemangioma of skin and subcutaneous tissue: Secondary | ICD-10-CM | POA: Diagnosis not present

## 2023-04-22 DIAGNOSIS — M1711 Unilateral primary osteoarthritis, right knee: Secondary | ICD-10-CM | POA: Diagnosis not present

## 2023-05-06 DIAGNOSIS — R300 Dysuria: Secondary | ICD-10-CM | POA: Diagnosis not present

## 2023-05-06 DIAGNOSIS — I10 Essential (primary) hypertension: Secondary | ICD-10-CM | POA: Diagnosis not present

## 2023-05-06 DIAGNOSIS — N390 Urinary tract infection, site not specified: Secondary | ICD-10-CM | POA: Diagnosis not present

## 2023-06-12 DIAGNOSIS — E559 Vitamin D deficiency, unspecified: Secondary | ICD-10-CM | POA: Diagnosis not present

## 2023-06-12 DIAGNOSIS — E039 Hypothyroidism, unspecified: Secondary | ICD-10-CM | POA: Diagnosis not present

## 2023-06-12 DIAGNOSIS — I10 Essential (primary) hypertension: Secondary | ICD-10-CM | POA: Diagnosis not present

## 2023-06-12 DIAGNOSIS — E7800 Pure hypercholesterolemia, unspecified: Secondary | ICD-10-CM | POA: Diagnosis not present

## 2023-08-07 ENCOUNTER — Ambulatory Visit: Admitting: Allergy & Immunology

## 2023-08-07 VITALS — BP 130/60 | HR 64 | Temp 97.7°F | Ht 59.0 in | Wt 113.8 lb

## 2023-08-07 DIAGNOSIS — L2089 Other atopic dermatitis: Secondary | ICD-10-CM

## 2023-08-07 DIAGNOSIS — J4489 Other specified chronic obstructive pulmonary disease: Secondary | ICD-10-CM

## 2023-08-07 DIAGNOSIS — J3089 Other allergic rhinitis: Secondary | ICD-10-CM

## 2023-08-07 NOTE — Progress Notes (Unsigned)
 FOLLOW UP  Date of Service/Encounter:  08/07/23   Assessment:   Perennial allergic rhinitis - doing very well with the nasal Atrovent   Asthma-COPD overlap syndrome - GOLD A   Eczema - controlled with triamcinolone as needed  Plan/Recommendations:   1. Perennial allergic rhinitis (indoor mold mix) - Continue with nasal ipratropium one spray per nostril daily (can increase to twice daily on bad days). - It seems like you have everything under excellent control.  - I am glad that you out of the moldy house!   2. Asthma-COPD overlap syndrome - Lung testing looked normal today.  - Call us if you want new prescriptions for your albuterol and your Symbicort.  - Daily controller medication(s): NOTHING - Prior to physical activity: albuterol 2 puffs 10-15 minutes before physical activity. - Rescue medications: albuterol 4 puffs every 4-6 hours as needed - Changes during respiratory infections or worsening symptoms: Add on Symbicort to 2 puffs twice daily for TWO WEEKS. - Asthma control goals:  * Full participation in all desired activities (may need albuterol before activity) * Albuterol use two time or less a week on average (not counting use with activity) * Cough interfering with sleep two time or less a month * Oral steroids no more than once a year * No hospitalizations  3. Return in about 1 year (around 08/06/2024). You can have the follow up appointment with Dr. Dellis Anes or a Nurse Practicioner (our Nurse Practitioners are excellent and always have Physician oversight!).    Subjective:   Kara Green is a 83 y.o. 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  Discussed the use of AI scribe software for clinical note transcription with the patient and/or guardian, who gave verbal consent to proceed.  Kara Green is a 83 y.o. female presenting for a follow up visit. She was last seen in May 2024. At that time, we continued with nasal Atrovent one spray per nostril daily. For his asthma. Continued with albuterol as needed with Symbicort added during flares.   Since the last visit, she has mostly done well.   Asthma/Respiratory Symptom History: She has a history of asthma but has not used her albuterol inhaler recently. She attributes improvement in her symptoms to moving out of a mold-infested house. She has Symbicort, but does not use it on a routine basis at all.  She does not need any refills of Symbicort or albuterol.  She will call us if needed.  Allergic Rhinitis Symptom History: She has chronic sinus issues characterized by significant drainage down her throat, a long-standing problem with a family history of similar issues affecting her mother and daughter. She has not required antibiotics for sinus infections in many years, particularly since moving from [place], where she experienced more frequent infections. However, since discontinuing Zyrtec on February 8th, she has experienced chest tightness, itching, and headaches. The itching and chest tightness vary in severity, with some days being worse than  others. She has not resumed Zyrtec due to concerns about side effects.  Skin Symptom History: She experiences daily itching in her ears, sometimes accompanied by aching. She has not used steroid ear drops but is considering using mineral or olive oil for relief.  She is not interested in any steroids.  She was diagnosed with osteoporosis and received a Reclast  infusion last year. Her mother also had osteoporosis and was treated for it.   Otherwise, there have been no changes to her past medical history, surgical history, family history, or social history.    Review of systems otherwise negative other than that mentioned in the HPI.    Objective:   Blood pressure 130/60, pulse 64, temperature 97.7 F (36.5 C), height 4\' 11"  (1.499 m), weight 113 lb 12.8 oz (51.6 kg), SpO2 94%. Body mass index is 22.98 kg/m.    Physical Exam Vitals reviewed.  Constitutional:      Appearance: She is well-developed.     Comments: Very pleasant. Talkative.   HENT:     Head: Normocephalic and atraumatic.     Right Ear: Tympanic membrane, ear canal and external ear normal.     Left Ear: Tympanic membrane, ear canal and external ear normal.     Nose: Mucosal edema present. No nasal deformity, septal deviation or rhinorrhea.     Right Turbinates: Enlarged, swollen and pale.     Left Turbinates: Enlarged, swollen and pale.     Right Sinus: No maxillary sinus tenderness or frontal sinus tenderness.     Left Sinus: No maxillary sinus tenderness or frontal sinus tenderness.     Comments: No nasal polyps noted.     Mouth/Throat:     Mouth: Mucous membranes are not pale and not dry.     Pharynx: Uvula midline.  Eyes:     General: Lids are normal. No allergic shiner.       Right eye: No discharge.        Left eye: No discharge.     Conjunctiva/sclera: Conjunctivae normal.     Right eye: Right conjunctiva is not injected. No chemosis.    Left eye: Left conjunctiva is not injected. No chemosis.    Pupils: Pupils are equal, round, and reactive to light.  Cardiovascular:     Rate and Rhythm: Normal rate and regular rhythm.     Heart sounds: Normal heart sounds.  Pulmonary:     Effort: Pulmonary effort is normal. No tachypnea, accessory muscle usage or respiratory distress.     Breath sounds: Normal breath sounds. No wheezing, rhonchi or rales.     Comments:  No wheezes or crackles. Moving air well in all lung fields. No increased work of breathing noted.  Chest:     Chest wall: No tenderness.  Lymphadenopathy:     Cervical: No cervical adenopathy.  Skin:    Coloration: Skin is not pale.     Findings: No abrasion, erythema, petechiae or rash. Rash is not papular, urticarial or vesicular.  Neurological:     Mental Status: She is alert.  Psychiatric:        Behavior: Behavior is cooperative.      Diagnostic studies:    Spirometry: results normal (FEV1: 0.89/68%, FVC: 1.38/81%, FEV1/FVC: 64%).    Spirometry consistent with normal pattern.   Allergy Studies: none       Kara Bonds, MD  [facility]

## 2023-08-07 NOTE — Patient Instructions (Addendum)
 1. Perennial allergic rhinitis (indoor mold mix) - Continue with nasal ipratropium one spray per nostril daily (can increase to twice daily on bad days). - It seems like you have everything under excellent control.  - I am glad that you out of the moldy house!   2. Asthma-COPD overlap syndrome - Lung testing looked normal today.  - Call us if you want new prescriptions for your albuterol and your Symbicort.  - Daily controller medication(s): NOTHING - Prior to physical activity: albuterol 2 puffs 10-15 minutes before physical activity. - Rescue medications: albuterol 4 puffs every 4-6 hours as needed - Changes during respiratory infections or worsening symptoms: Add on Symbicort to 2 puffs twice daily for TWO WEEKS. - Asthma control goals:  * Full participation in all desired activities (may need albuterol before activity) * Albuterol use two time or less a week on average (not counting use with activity) * Cough interfering with sleep two time or less a month * Oral steroids no more than once a year * No hospitalizations  3. Return in about 1 year (around 08/06/2024). You can have the follow up appointment with Dr. Dellis Anes or a Nurse Practicioner (our Nurse Practitioners are excellent and always have Physician oversight!).    Please inform us of any Emergency Department visits, hospitalizations, or changes in symptoms. Call us before going to the ED for breathing or allergy symptoms since we might be able to fit you in for a sick visit. Feel free to contact us anytime with any questions, problems, or concerns.  It was a pleasure to see you again today!  Websites that have reliable patient information: 1. American Academy of Asthma, Allergy, and Immunology: www.aaaai.org 2. Food Allergy Research and Education (FARE): foodallergy.org 3. Mothers of Asthmatics: http://www.asthmacommunitynetwork.org 4. American College of Allergy, Asthma, and Immunology: www.acaai.org      "Like" [place]  on Facebook and Instagram for our latest updates!      A healthy democracy works best when [employer] participate! Make sure you are registered to vote! If you have moved or changed any of your contact information, you will need to get this updated before voting! Scan the QR codes below to learn more!

## 2023-08-08 ENCOUNTER — Encounter: Payer: Self-pay | Admitting: Allergy & Immunology

## 2023-08-25 ENCOUNTER — Ambulatory Visit: Admitting: Podiatry

## 2023-08-25 ENCOUNTER — Ambulatory Visit (INDEPENDENT_AMBULATORY_CARE_PROVIDER_SITE_OTHER)

## 2023-08-25 ENCOUNTER — Encounter: Payer: Self-pay | Admitting: Podiatry

## 2023-08-25 DIAGNOSIS — M21619 Bunion of unspecified foot: Secondary | ICD-10-CM | POA: Diagnosis not present

## 2023-08-25 DIAGNOSIS — L600 Ingrowing nail: Secondary | ICD-10-CM | POA: Diagnosis not present

## 2023-08-25 DIAGNOSIS — M79671 Pain in right foot: Secondary | ICD-10-CM | POA: Diagnosis not present

## 2023-08-25 DIAGNOSIS — L84 Corns and callosities: Secondary | ICD-10-CM | POA: Diagnosis not present

## 2023-08-25 DIAGNOSIS — M79675 Pain in left toe(s): Secondary | ICD-10-CM

## 2023-08-25 DIAGNOSIS — B351 Tinea unguium: Secondary | ICD-10-CM

## 2023-08-25 DIAGNOSIS — M79672 Pain in left foot: Secondary | ICD-10-CM

## 2023-08-25 DIAGNOSIS — M79674 Pain in right toe(s): Secondary | ICD-10-CM | POA: Diagnosis not present

## 2023-08-25 NOTE — Patient Instructions (Signed)
 You can use VOLTAREN GEL to the top of the foot as needed for pain  --  Bunion: What to Know A bunion, or hallux valgus, is a bump that forms slowly on the inner side of your big toe joint. It happens when your big toe turns toward your second toe. Bunions may be small at first but get bigger over time. They can make walking painful. What are the causes? A bunion may be caused by: Wearing narrow or pointed shoes that force your big toe to press against the other toes. Problems with how your foot is shaped. Changes in your foot caused by some diseases or conditions. A foot injury. What increases the risk? You're more likely to get a bunion if: You wear shoes that squeeze your toes. You have certain diseases, such as: Rheumatoid arthritis. Cerebral palsy. Someone in your family gets bunions too. You have flat feet or low arches. You do things that put a lot of pressure on your feet, such as ballet. What are the signs or symptoms? The main symptom is a bump on the inner side of your big toe. You may also have: Pain. Redness and swelling around your big toe. Thick or hard skin on your big toe or between your toes. Stiffness or loss of movement in your big toe. Trouble walking. How is this diagnosed? A bunion may be diagnosed based on your symptoms, medical history, and activities.  You may also have tests, such as an X-ray. This helps your health care provider see the bones in your foot and look for damage to your joint. How is this treated? Treatment can help with symptoms and can stop the bunion from getting worse. What you need to do may depend on how bad your symptoms are. You may need to: Wear shoes that have a wide toe box. Use bunion pads to cushion your toes. Tape your toes together. Place an insert called an orthotic device in your shoe. This can help take pressure off your toe joint. Take medicine to help with pain and swelling. Put ice or heat on your foot. Do stretching  exercises. Have surgery. You may need this if the bunion is causing very bad symptoms. Follow these instructions at home: Managing pain, stiffness, and swelling     Use ice or an ice pack as told. Place a towel between your skin and the ice. Leave the ice on for 20 minutes, 2-3 times a day. Use heat as told. Use the heat source that your provider recommends, such as a moist heat pack or a heating pad. Do this as often as told. Place a towel between your skin and the heat source. Leave the heat on for 20-30 minutes. If your skin turns red, take off the ice or heat right away to prevent skin damage. The risk of damage is higher if you can't feel pain, heat, or cold. General instructions Exercise as told. Support your toe joint as told with: The right footwear. Shoe padding. Taping. Wear shoes that have a wide toe box. Avoid wearing tight shoes or shoes with high heels. Take your medicines only as told. Do not smoke, vape, or use nicotine or tobacco. Keep all follow-up visits. Your provider will check if the treatments are working. Contact a health care provider if: Your symptoms get worse. Your symptoms don't get better in 2 weeks. Get help right away if: You have very bad pain and trouble walking. This information is not intended to replace advice given  to you by your health care provider. Make sure you discuss any questions you have with your health care provider. Document Revised: 11/22/2022 Document Reviewed: 11/22/2022 Elsevier Patient Education  2024 [employer].

## 2023-08-27 NOTE — Progress Notes (Signed)
    Subjective:  Patient ID: Kara Green, female    DOB: [birth year],  MRN: [id number]  Chief Complaint  Patient presents with   Foot Pain    RM#11 Bilateral foot and toe pain more on left foot X 2-3 months.    Discussed the use of AI scribe software for clinical note transcription with the patient, who gave verbal consent to proceed.  History of Present Illness The patient, with a history of foot problems, presents with pain in the right second toe due to hard skin build-up and a bunion. The patient reports that the pain is especially severe when the toenail gets too long. The patient has been using over-the-counter urea cream for the hard skin but reports no significant improvement. The patient also reports pain in the left foot when stepping down, which she attributes to arthritis. The patient has been wearing Crocs for comfort due to the foot problems. The patient also reports that shoes that were previously comfortable can now only be worn for a maximum of two to three hours before the bunion starts to hurt severely. The patient also reports that her feet have gotten bigger.      Objective:    Physical Exam General: AAO x3, NAD  Dermatological: Nails are hypertrophic, dystrophic, brittle, discolored, elongated 10. No surrounding redness or drainage. Tenderness nails 1-5 bilaterally.  Incurvation of the hallux toenail without any signs of infection.  No open lesions.   Vascular: Dorsalis Pedis artery and Posterior Tibial artery pedal pulses are 2/4 bilateral with immedate capillary fill time. There is no pain with calf compression, swelling, warmth, erythema.   Neruologic: Grossly intact via light touch bilateral.    Musculoskeletal: Tenderness palpation right midfoot.  There is no specific area pinpoint tenderness.  Bunion present on the right foot with digital contractures.  Gait: Unassisted, Nonantalgic.     No images are attached to the encounter.     Results Procedure: Toenail trimming and callus debridement Description: Toenails were trimmed and filed. Callus was debrided.  RADIOLOGY Right foot X-ray: Present on the bunion present in the right foot.  Osteopenia is noted.  Calcaneal spurring present.    Assessment:   1. Bunion   2. Callus   3. Ingrown toenail      Plan:  Patient was evaluated and treated and all questions answered.  Assessment and Plan Assessment & Plan Bunion Bunion on right foot causing significant pain, prefers non-surgical management. - Provide pads and cushions to offload pressure. - Recommend toe spacers for alignment. - Advise on footwear with wider toe box and good arch support.  Arthritis of the right foot Mild arthritis in right foot, pain worsened compared to previous x-rays. - Recommend Voltaren gel for pain and inflammation. - Emphasize wearing supportive shoes.  Onychocryptosis (Ingrown toenail) Difficulty managing toenails, right big toe affected with history of bleeding. -Sharply debrided nails x 10 without any complications or bleeding. -Monitor ingrown toenail for any signs or symptoms of infection.  Consider partial nail avulsion if needed. - Schedule regular nail care every three months.    Return in about 3 months (around 11/24/2023), or if symptoms worsen or fail to improve, for nail/callus trim; bunions.   Vivi Barrack DPM

## 2023-09-01 DIAGNOSIS — H401122 Primary open-angle glaucoma, left eye, moderate stage: Secondary | ICD-10-CM | POA: Diagnosis not present

## 2023-09-01 DIAGNOSIS — H401113 Primary open-angle glaucoma, right eye, severe stage: Secondary | ICD-10-CM | POA: Diagnosis not present

## 2023-10-01 DIAGNOSIS — M25551 Pain in right hip: Secondary | ICD-10-CM | POA: Diagnosis not present

## 2023-10-01 DIAGNOSIS — M47816 Spondylosis without myelopathy or radiculopathy, lumbar region: Secondary | ICD-10-CM | POA: Diagnosis not present

## 2023-10-09 ENCOUNTER — Ambulatory Visit
Admission: RE | Admit: 2023-10-09 | Discharge: 2023-10-09 | Disposition: A | Discharge status: Home / Self Care | Source: Ambulatory Visit | Attending: Nurse Practitioner

## 2023-10-09 VITALS — BP 125/73 | HR 66 | Temp 97.6°F | Resp 14

## 2023-10-09 DIAGNOSIS — R10819 Abdominal tenderness, unspecified site: Secondary | ICD-10-CM | POA: Insufficient documentation

## 2023-10-09 DIAGNOSIS — N3000 Acute cystitis without hematuria: Secondary | ICD-10-CM | POA: Diagnosis not present

## 2023-10-09 DIAGNOSIS — M542 Cervicalgia: Secondary | ICD-10-CM | POA: Diagnosis not present

## 2023-10-09 LAB — POCT URINALYSIS DIP (MANUAL ENTRY)
Bilirubin, UA: NEGATIVE
Blood, UA: NEGATIVE
Glucose, UA: NEGATIVE mg/dL
Ketones, POC UA: NEGATIVE mg/dL
Nitrite, UA: NEGATIVE
Protein Ur, POC: NEGATIVE mg/dL
Spec Grav, UA: <=1.005 — AB (ref 1.010–1.025)
Urobilinogen, UA: 0.2 U/dL
pH, UA: 5.5 (ref 5.0–8.0)

## 2023-10-09 MED ORDER — CEPHALEXIN 500 MG PO CAPS: 500.0000 mg | ORAL_CAPSULE | Freq: Three times a day (TID) | ORAL | 0 refills | Status: AC

## 2023-10-09 MED ORDER — FLUCONAZOLE 150 MG PO TABS: 150.0000 mg | ORAL_TABLET | Freq: Once | ORAL | 0 refills | Status: AC

## 2023-10-09 NOTE — Discharge Instructions (Addendum)
 You were seen today for symptoms consistent with a urinary tract infection (UTI). You have been prescribed Cephalexin  to treat the infection. Take the antibiotics exactly as prescribed and complete the full course, even if you start feeling better. A urine culture has been sent to identify the specific bacteria causing the infection and to confirm that the prescribed antibiotic is appropriate. You will only be contacted if your results are abnormal; otherwise, you may review them in your MyChart account.   Take the fluconazole  (diflucan ) tablet that you already had at the same time you start the antibiotics. You should then take the one prescribed fluconazole  after the 3rd day on the antibiotics. Hopefully, this regimen will prevent you from getting a yeast infection.   It is important to stay well hydrated by drinking plenty of fluids throughout the day. This helps flush out your urinary system and keeps your urine light yellow, which is a sign of good hydration. Avoid caffeine and alcohol, as they can irritate the bladder. Be sure to urinate regularly and empty your bladder fully. Do not hold your urine for extended periods. Always wipe from front to back after using the bathroom and use a clean tissue for each wipe. Avoid douching or using sprays or powders in the genital area, as these can cause irritation. Follow up with your healthcare provider if your symptoms do not improve, get worse, or return after completing your treatment.  You were also seen today for left-sided neck pain that started without any injury. Based on your exam and symptoms, the pain appears to be musculoskeletal in nature. There is no swelling of the lymph nodes or signs of infection. You should take the prescribed meloxicam, which is an anti-inflammatory medication that may help reduce pain and inflammation. Do not take any additional over-the-counter NSAIDs, such as ibuprofen or naproxen, while using meloxicam, as this may increase  the risk of side effects. To help with discomfort, you can apply moist heat to the neck several times a day. This can relax the muscles and improve circulation in the area. Try to limit any activities that make the pain worse, and rest the neck when possible.  If your symptoms do not improve, or if you develop new issues such as numbness, tingling, weakness, fever, or difficulty moving your neck, follow up with your primary care provider or an orthopedic specialist for further evaluation.

## 2023-10-09 NOTE — ED Triage Notes (Signed)
 Pt reports intermittent dysuria and sharp suprapubic pain with pressure x4 weeks. Pt kept thinking it was getting better, but would return with same intensity.   Pt is also concerned about swollen lymph node on R side of neck that she noticed a few weeks ago. States some days it is very swollen, but will go down the next.

## 2023-10-09 NOTE — ED Provider Notes (Signed)
 [facility]    CSN: [id number] Arrival date & time: 10/09/23  1251      History   Chief Complaint Chief Complaint  Patient presents with   Dysuria   Abdominal Pain   Lymphadenopathy    HPI Kara Green is a 83 y.o. female.   Kara Green is a 83 y.o. female that presents with symptoms suggestive of a urinary tract infection (UTI) and neck pain. She reports a horrible backache and pressure in the lower abdominal area, just above the pelvis. The pain starts on the left side and wraps around to her back, becoming so severe at times that she needs to use a recliner for relief. She notes that Tylenol  arthritis sometimes does not alleviate the pain. The patient also experiences fluctuations in urinary frequency, sometimes urinating frequently and other times going hours without urination. She denies any burning sensation during urination, which she notes is different from her previous UTI experiences. Additionally, the patient complains of right-sided neck pain and swelling. She describes the area as "really hurting" and sometimes being "really swollen." The patient denies recent illness and states that it doesn't feel like a lymph node, but rather like muscle pain. The patient reports a history of recurrent UTIs, mentioning that she saw a urologist in [place]  several years ago due to frequent infections. Her last UTI was earlier this year, for which she was treated by her primary care physician. She recalls being treated for a UTI at this facility in March of last year with cephalexin , which resulted in a yeast infection. The patient denies fever, nausea, vomiting, vaginal discharge, or blood in her urine. She also mentions experiencing itching with large, red, puffy welts, which started after she stopped taking Zyrtec in February. She denies any new exposures related to this itching.  The following portions of the patient's history were reviewed and updated as  appropriate: allergies, current medications, past family history, past medical history, past social history, past surgical history, and problem list.         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      Home Medications    Prior to Admission medications   Medication Sig Start Date End Date Taking? Authorizing Provider  acetaminophen  (TYLENOL  8 HOUR) 650 MG CR tablet 2 tablets Orally every 8 hrs as needed for pain 10/10/22  Yes [provider]  amLODipine  (NORVASC ) 10 MG tablet TAKE 1 TABLET BY MOUTH EVERY DAY 12/04/20  Yes Webb, Padonda B, FNP  Ascorbic Acid (VITAMIN C) 100 MG tablet Take 100 mg by mouth daily.   Yes [provider]  aspirin EC 81 MG tablet Take 81 mg by mouth daily.  Yes [provider]  cephALEXin  (KEFLEX ) 500 MG capsule Take 1 capsule (500 mg total) by mouth in the morning, at noon, and at bedtime for 7 days. 10/09/23 10/16/23 Yes Vir Whetstine, FNP  Cholecalciferol (VITAMIN D ) 2000 units CAPS Take 1 capsule by mouth daily.   Yes [provider]  Collagen 500 MG CAPS Take by mouth daily.   Yes [provider]  fluconazole  (DIFLUCAN ) 150 MG tablet Take 1 tablet (150 mg total) by mouth once for 1 dose. Take after the 3rd day on antibiotics  10/09/23 10/09/23 Yes Loys Hoselton, FNP  Garlic 10 MG CAPS Take by mouth daily.   Yes [provider]  Glucosamine-Chondroit-Vit C-Mn (GLUCOSAMINE 1500 COMPLEX) CAPS as directed Orally   Yes [provider]  ipratropium (ATROVENT ) 0.06 % nasal spray Place 2 sprays into both nostrils in the morning. 10/03/22 10/09/23 Yes Rochester Chuck, MD  L-Lysine 1000 MG TABS 1 tablet Orally once a day   Yes [provider]  latanoprost (XALATAN) 0.005 % ophthalmic solution Place 1 drop into both eyes at bedtime.   Yes [provider]  levothyroxine  (SYNTHROID ) 50 MCG tablet TAKE 1 TABLET BY MOUTH EVERY DAY 12/04/20  Yes Webb, Padonda B, FNP  Magnesium 400 MG CAPS Take by mouth daily.   Yes [provider]  meloxicam (MOBIC) 7.5 MG tablet Take 7.5 mg by mouth daily. 10/01/23  Yes [provider]  Misc Natural Products (TURMERIC CURCUMIN) CAPS as directed Orally   Yes [provider]  Multiple Vitamins-Minerals (EYE VITAMINS) CAPS 1 capsule.   Yes [provider]  Omega-3 Fatty Acids (FISH OIL) 1200 MG CAPS Take by mouth daily.   Yes [provider]  spironolactone  (ALDACTONE ) 25 MG tablet TAKE 2 TABLETS BY MOUTH EVERY DAY 12/08/20  Yes Webb, Padonda B, FNP  triamcinolone  cream (KENALOG ) 0.1 % Apply 1 application topically 2 (two) times daily. 08/27/18  Yes Buena Carmine, NP  vitamin E 100 UNIT capsule Take by mouth daily.   Yes [provider]  Zinc 50 MG TABS 1 tablet Orally Once a day   Yes [provider]  APPLE CIDER VINEGAR PO Take by mouth. Patient not taking: Reported on 10/09/2023    [provider]  apraclonidine (IOPIDINE) 0.5 % ophthalmic solution Place 1 drop into both eyes 2 (two) times daily. Patient not taking: Reported on 10/09/2023    [provider]  cetirizine (ZYRTEC) 10 MG tablet 1 tablet Orally Once a day Patient not taking: Reported on 10/09/2023    [provider]  famotidine  (PEPCID ) 20 MG tablet Take 1 tablet (20 mg total) by mouth daily. Patient not taking: Reported on 10/09/2023 11/29/22   Dodson Freestone, FNP  FLUAD QUADRIVALENT 0.5 ML injection Inject 0.5 mLs into the muscle once. Patient not taking: Reported on 10/09/2023 02/05/22   [provider]  glucosamine-chondroitin 500-400 MG tablet Take 1 tablet by mouth daily. Patient not taking: Reported on 10/09/2023    [provider]  loratadine (KLS ALLERCLEAR) 10 MG tablet Take 10 mg by mouth daily.  Patient not taking: Reported on 10/09/2023    [provider]  timolol (TIMOPTIC) 0.5 % ophthalmic solution SMARTSIG:In Eye(s) 05/09/20   [provider]    Family History Family History  Problem Relation Age of Onset   Arthritis Mother    Breast cancer Mother        in 70's   Stroke Mother    Hypertension  Mother    Hypertension Father    Diabetes Daughter    Diabetes Son    Arthritis Maternal Grandmother    Arthritis Maternal Grandfather    Colon cancer Neg Hx    Esophageal cancer Neg Hx    Inflammatory bowel disease Neg Hx    Liver disease Neg Hx    Pancreatic cancer Neg Hx    Rectal cancer Neg Hx    Stomach cancer Neg Hx     Social History Social History   Tobacco Use   Smoking status: Former   Smokeless tobacco: Never   Tobacco comments:    quit 25 years ago  Vaping Use   Vaping status: Never Used  Substance Use Topics   Alcohol use: Yes    Alcohol/week: 7.0 standard drinks of alcohol    Types: 7 Glasses of wine per week    Comment: with dinner   Drug use: No     Allergies   Acetazolamide, Benadryl  [diphenhydramine  hcl], Codeine, Diphenhydramine , Gabapentin, Netarsudil-latanoprost, Penicillin g, Penicillins, Pseudoephedrine hcl, Sulfa antibiotics, Tetracycline, Decongestant [oxymetazoline], and Tetracyclines & related   Review of Systems Review of Systems  Constitutional:  Negative for fever.  Gastrointestinal:   Positive for abdominal pain (suprapubic). Negative for nausea and vomiting.  Genitourinary:  Positive for flank pain (left sided, radiating to the lower back) and frequency. Negative for dysuria, hematuria and vaginal discharge.  Musculoskeletal:  Positive for back pain.  Skin:  Negative for rash.  All other systems reviewed and are negative.    Physical Exam Triage Vital Signs ED Triage Vitals  Encounter Vitals Group     BP 10/09/23 1309 125/73     Systolic BP Percentile --      Diastolic BP Percentile --      Pulse Rate 10/09/23 1309 66     Resp 10/09/23 1309 14     Temp 10/09/23 1309 97.6 F (36.4 C)     Temp Source 10/09/23 1309 Oral     SpO2 10/09/23 1309 95 %     Weight --      Height --      Head Circumference --      Peak Flow --      Pain Score 10/09/23 1310 6     Pain Loc --      Pain Education --      Exclude from Growth Chart --    No data found.  Updated Vital Signs BP 125/73 (BP Location: Left Arm)   Pulse 66   Temp 97.6 F (36.4 C) (Oral)   Resp 14   SpO2 95%   Visual Acuity Right Eye Distance:   Left Eye Distance:   Bilateral Distance:    Right Eye Near:   Left Eye Near:    Bilateral Near:     Physical Exam Vitals and nursing note reviewed.  Constitutional:      General: She is awake. She is not in acute distress.    Appearance: Normal appearance. She is well-developed. She is not ill-appearing, toxic-appearing or diaphoretic.  HENT:     Head: Normocephalic.     Nose: Nose normal.     Mouth/Throat:     Mouth: Mucous membranes are moist.  Eyes:     Conjunctiva/sclera: Conjunctivae normal.  Cardiovascular:     Rate and Rhythm: Normal rate.  Pulmonary:     Effort: Pulmonary effort is normal.  Abdominal:     General: There is no distension.     Palpations: Abdomen  is soft.     Tenderness: There is abdominal tenderness in the suprapubic area. There is no right CVA tenderness or left CVA tenderness.  Genitourinary:    Comments:  Deferred Musculoskeletal:        General: Normal range of motion.     Cervical back: Normal range of motion and neck supple. No rigidity. Pain with movement and muscular tenderness present. No spinous process tenderness. Normal range of motion.  Lymphadenopathy:     Cervical: No cervical adenopathy.  Skin:    General: Skin is warm and dry.  Neurological:     General: No focal deficit present.     Mental Status: She is alert and oriented to person, place, and time.  Psychiatric:        Mood and Affect: Mood normal.        Behavior: Behavior normal. Behavior is cooperative.      UC Treatments / Results  Labs (all labs ordered are listed, but only abnormal results are displayed) Labs Reviewed  POCT URINALYSIS DIP (MANUAL ENTRY) - Abnormal; Notable for the following components:      Result Value   Clarity, UA hazy (*)    Spec Grav, UA <=1.005 (*)    Leukocytes, UA Small (1+) (*)    All other components within normal limits  URINE CULTURE    EKG   Radiology No results found.  Procedures Procedures (including critical care time)  Medications Ordered in UC Medications - No data to display  Initial Impression / Assessment and Plan / UC Course  I have reviewed the triage vital signs and the nursing notes.  Pertinent labs & imaging results that were available during my care of the patient were reviewed by me and considered in my medical decision making (see chart for details).    Patient presents with symptoms consistent with a urinary tract infection, including lower back pain, suprapubic pressure, and urinary frequency. Urinalysis shows hazy urine with leukocytes, supporting the diagnosis of UTI. She denies fever, nausea, vomiting, vaginal discharge, or hematuria. Cephalexin  prescribed for 7 days, to be taken three times daily, with food if stomach upset occurs. Urine culture and sensitivity sent and results pending. Diflucan  prescribed prophylactically to prevent yeast  infection, with instructions to take one dose now and one after the third day of antibiotics; only one tablet provided as patient has one at home. Patient also reports left-sided neck pain; exam is benign and findings are consistent with musculoskeletal etiology. She has a prescription for meloxicam from her PCP but has not started it; advised to begin as directed. Moist heat recommended to affected area and advised to monitor symptoms and follow up if they worsen or persist.  Today's evaluation has revealed no signs of a dangerous process. Discussed diagnosis with patient and/or guardian. Patient and/or guardian aware of their diagnosis, possible red flag symptoms to watch out for and need for close follow up. Patient and/or guardian understands verbal and written discharge instructions. Patient and/or guardian comfortable with plan and disposition.  Patient and/or guardian has a clear mental status at this time, good insight into illness (after discussion and teaching) and has clear judgment to make decisions regarding their care  Documentation was completed with the aid of voice recognition software. Transcription may contain typographical errors. Final Clinical Impressions(s) / UC Diagnoses   Final diagnoses:  Suprapubic tenderness  Acute cystitis without hematuria  Musculoskeletal neck pain     Discharge Instructions      You were seen  today for symptoms consistent with a urinary tract infection (UTI). You have been prescribed Cephalexin  to treat the infection. Take the antibiotics exactly as prescribed and complete the full course, even if you start feeling better. A urine culture has been sent to identify the specific bacteria causing the infection and to confirm that the prescribed antibiotic is appropriate. You will only be contacted if your results are abnormal; otherwise, you may review them in your MyChart account.   Take the fluconazole  (diflucan ) tablet that you already had at the  same time you start the antibiotics. You should then take the one prescribed fluconazole  after the 3rd day on the antibiotics. Hopefully, this regimen will prevent you from getting a yeast infection.   It is important to stay well hydrated by drinking plenty of fluids throughout the day. This helps flush out your urinary system and keeps your urine light yellow, which is a sign of good hydration. Avoid caffeine and alcohol, as they can irritate the bladder. Be sure to urinate regularly and empty your bladder fully. Do not hold your urine for extended periods. Always wipe from front to back after using the bathroom and use a clean tissue for each wipe. Avoid douching or using sprays or powders in the genital area, as these can cause irritation. Follow up with your healthcare provider if your symptoms do not improve, get worse, or return after completing your treatment.  You were also seen today for left-sided neck pain that started without any injury. Based on your exam and symptoms, the pain appears to be musculoskeletal in nature. There is no swelling of the lymph nodes or signs of infection. You should take the prescribed meloxicam, which is an anti-inflammatory medication that may help reduce pain and inflammation. Do not take any additional over-the-counter NSAIDs, such as ibuprofen or naproxen, while using meloxicam, as this may increase the risk of side effects. To help with discomfort, you can apply moist heat to the neck several times a day. This can relax the muscles and improve circulation in the area. Try to limit any activities that make the pain worse, and rest the neck when possible.  If your symptoms do not improve, or if you develop new issues such as numbness, tingling, weakness, fever, or difficulty moving your neck, follow up with your primary care provider or an orthopedic specialist for further evaluation.       ED Prescriptions     Medication Sig Dispense Auth. Provider    cephALEXin  (KEFLEX ) 500 MG capsule Take 1 capsule (500 mg total) by mouth in the morning, at noon, and at bedtime for 7 days. 21 capsule Maryruth Sol, FNP   fluconazole  (DIFLUCAN ) 150 MG tablet Take 1 tablet (150 mg total) by mouth once for 1 dose. Take after the 3rd day on antibiotics 1 tablet Maryruth Sol, FNP      PDMP not reviewed this encounter.   Maryruth Sol, [place] 10/09/23 [phone]

## 2023-10-11 LAB — URINE CULTURE: Culture: 30000 — AB

## 2023-10-13 ENCOUNTER — Ambulatory Visit (HOSPITAL_COMMUNITY): Payer: Self-pay

## 2023-11-11 ENCOUNTER — Other Ambulatory Visit: Payer: Self-pay | Admitting: Allergy & Immunology

## 2023-11-17 DIAGNOSIS — I10 Essential (primary) hypertension: Secondary | ICD-10-CM | POA: Diagnosis not present

## 2023-11-17 DIAGNOSIS — E7800 Pure hypercholesterolemia, unspecified: Secondary | ICD-10-CM | POA: Diagnosis not present

## 2023-11-17 DIAGNOSIS — E039 Hypothyroidism, unspecified: Secondary | ICD-10-CM | POA: Diagnosis not present

## 2023-11-17 DIAGNOSIS — M19049 Primary osteoarthritis, unspecified hand: Secondary | ICD-10-CM | POA: Diagnosis not present

## 2023-11-20 DIAGNOSIS — R233 Spontaneous ecchymoses: Secondary | ICD-10-CM | POA: Diagnosis not present

## 2023-11-26 DIAGNOSIS — H5213 Myopia, bilateral: Secondary | ICD-10-CM | POA: Diagnosis not present

## 2023-12-02 ENCOUNTER — Encounter: Payer: Self-pay | Admitting: Podiatry

## 2023-12-02 ENCOUNTER — Ambulatory Visit: Admitting: Podiatry

## 2023-12-02 VITALS — BP 128/72 | HR 66 | Temp 97.2°F | Resp 16 | Ht 59.0 in | Wt 113.0 lb

## 2023-12-02 DIAGNOSIS — M79675 Pain in left toe(s): Secondary | ICD-10-CM | POA: Diagnosis not present

## 2023-12-02 DIAGNOSIS — M79674 Pain in right toe(s): Secondary | ICD-10-CM | POA: Diagnosis not present

## 2023-12-02 DIAGNOSIS — B351 Tinea unguium: Secondary | ICD-10-CM | POA: Diagnosis not present

## 2023-12-02 NOTE — Progress Notes (Signed)
 Subjective: Chief Complaint  Patient presents with   Callouses    Patient is here for 36M F/U for bunions and calluses   83 year old female presents the office with above concerns.  Her main concern today is the toenails particular the right second toenail as it gets curved into the skin causing discomfort.  She has been using over-the-counter antifungal medication which helps soften the toenail.   Objective: AAO x3, NAD DP/PT pulses palpable bilaterally, CRT less than 3 seconds Nails are hypertrophic, dystrophic, brittle, discolored, elongated 10. In particular the 2nd digit nail on the right is the most incurvated, almost a pincher nail deformity. No surrounding redness or drainage. Tenderness nails 1-5 bilaterally. No open lesions or pre-ulcerative lesions are identified today. No pain with calf compression, swelling, warmth, erythema  Assessment: Symptomatic onychomycosis, pincer nail deformity   Plan: -All treatment options discussed with the patient including all alternatives, risks, complications.  -Sharp debrided nails x 10 without any complications or bleeding.  Continue topical antifungal medication. -Patient encouraged to call the office with any questions, concerns, change in symptoms.   Donnice JONELLE Fees DPM

## 2023-12-04 ENCOUNTER — Other Ambulatory Visit: Payer: Self-pay

## 2023-12-04 ENCOUNTER — Telehealth: Payer: Self-pay | Admitting: Pharmacy Technician

## 2023-12-04 DIAGNOSIS — E559 Vitamin D deficiency, unspecified: Secondary | ICD-10-CM | POA: Diagnosis not present

## 2023-12-04 DIAGNOSIS — M810 Age-related osteoporosis without current pathological fracture: Secondary | ICD-10-CM | POA: Diagnosis not present

## 2023-12-04 NOTE — Telephone Encounter (Signed)
 Auth Submission: NO AUTH NEEDED Site of care: Site of care: [facility] Payer: [employer] MEDICARE Medication & CPT/J Code(s) submitted: Reclast  (Zolendronic acid) J3489 Diagnosis Code:  Route of submission (phone, fax, portal):  Phone # Fax # Auth type: Buy/Bill PB Units/visits requested: X1 DOSE Reference number:  Approval from: 12/04/23 to 05/19/24

## 2023-12-10 DIAGNOSIS — L570 Actinic keratosis: Secondary | ICD-10-CM | POA: Diagnosis not present

## 2023-12-10 DIAGNOSIS — L501 Idiopathic urticaria: Secondary | ICD-10-CM | POA: Diagnosis not present

## 2023-12-18 DIAGNOSIS — E039 Hypothyroidism, unspecified: Secondary | ICD-10-CM | POA: Diagnosis not present

## 2023-12-18 DIAGNOSIS — E7800 Pure hypercholesterolemia, unspecified: Secondary | ICD-10-CM | POA: Diagnosis not present

## 2023-12-18 DIAGNOSIS — I10 Essential (primary) hypertension: Secondary | ICD-10-CM | POA: Diagnosis not present

## 2023-12-18 DIAGNOSIS — M19049 Primary osteoarthritis, unspecified hand: Secondary | ICD-10-CM | POA: Diagnosis not present

## 2024-01-02 ENCOUNTER — Ambulatory Visit

## 2024-01-02 MED ORDER — ACETAMINOPHEN 325 MG PO TABS
650.0000 mg | ORAL_TABLET | Freq: Once | ORAL | Status: AC
Start: 2024-01-02 — End: ?

## 2024-01-02 MED ORDER — ZOLEDRONIC ACID 5 MG/100ML IV SOLN: 5.0000 mg | Freq: Once | INTRAVENOUS | Status: AC

## 2024-01-02 MED ORDER — SODIUM CHLORIDE 0.9 % IV SOLN
INTRAVENOUS | Status: AC
Start: 2024-01-02 — End: ?

## 2024-01-02 MED ORDER — DIPHENHYDRAMINE HCL 25 MG PO CAPS
25.0000 mg | ORAL_CAPSULE | Freq: Once | ORAL | Status: AC
Start: 2024-01-02 — End: ?

## 2024-01-09 ENCOUNTER — Ambulatory Visit

## 2024-01-09 VITALS — BP 123/67 | HR 57 | Temp 98.1°F | Resp 16 | Ht 60.0 in | Wt 112.6 lb

## 2024-01-09 DIAGNOSIS — M810 Age-related osteoporosis without current pathological fracture: Secondary | ICD-10-CM | POA: Diagnosis not present

## 2024-01-09 MED ORDER — ZOLEDRONIC ACID 5 MG/100ML IV SOLN
5.0000 mg | Freq: Once | INTRAVENOUS | Status: AC
Start: 2024-01-09 — End: 2024-01-09
  Administered 2024-01-09: 5 mg via INTRAVENOUS
  Filled 2024-01-09: qty 100

## 2024-01-09 MED ORDER — ACETAMINOPHEN 325 MG PO TABS: 650.0000 mg | ORAL_TABLET | Freq: Once | ORAL | Status: DC

## 2024-01-09 MED ORDER — DIPHENHYDRAMINE HCL 25 MG PO CAPS: 25.0000 mg | ORAL_CAPSULE | Freq: Once | ORAL | Status: DC

## 2024-01-09 NOTE — Progress Notes (Signed)
 Diagnosis: Osteoporosis  Provider:  Lonna Coder MD  Procedure: IV Infusion  IV Type: Peripheral, IV Location: R Antecubital  Reclast  (Zolendronic Acid), Dose: 5 mg  Infusion Start Time: 1235  Infusion Stop Time: 1305  Post Infusion IV Care: Patient declined observation and Peripheral IV Discontinued  Discharge: Condition: Good, Destination: Home . AVS Declined  Performed by:  Maximiano JONELLE Pouch, LPN

## 2024-01-18 DIAGNOSIS — E7800 Pure hypercholesterolemia, unspecified: Secondary | ICD-10-CM | POA: Diagnosis not present

## 2024-01-18 DIAGNOSIS — I10 Essential (primary) hypertension: Secondary | ICD-10-CM | POA: Diagnosis not present

## 2024-01-18 DIAGNOSIS — E039 Hypothyroidism, unspecified: Secondary | ICD-10-CM | POA: Diagnosis not present

## 2024-01-18 DIAGNOSIS — M19049 Primary osteoarthritis, unspecified hand: Secondary | ICD-10-CM | POA: Diagnosis not present

## 2024-02-02 ENCOUNTER — Ambulatory Visit: Admitting: Podiatry

## 2024-02-02 DIAGNOSIS — H401122 Primary open-angle glaucoma, left eye, moderate stage: Secondary | ICD-10-CM | POA: Diagnosis not present

## 2024-02-02 DIAGNOSIS — H401113 Primary open-angle glaucoma, right eye, severe stage: Secondary | ICD-10-CM | POA: Diagnosis not present

## 2024-02-13 ENCOUNTER — Ambulatory Visit: Admitting: Podiatry

## 2024-02-13 ENCOUNTER — Encounter: Payer: Self-pay | Admitting: Podiatry

## 2024-02-13 DIAGNOSIS — M79674 Pain in right toe(s): Secondary | ICD-10-CM | POA: Diagnosis not present

## 2024-02-13 DIAGNOSIS — B351 Tinea unguium: Secondary | ICD-10-CM

## 2024-02-13 DIAGNOSIS — M79675 Pain in left toe(s): Secondary | ICD-10-CM | POA: Diagnosis not present

## 2024-02-13 NOTE — Progress Notes (Signed)
 Subjective: Chief Complaint  Patient presents with   RFC     RFC Non diabetic toenail trim.     83 year old female presents the office with above concerns.  Presents today for concerns of her nails becoming thick and discolored she has difficulty trimming them they cause discomfort.  No swelling or redness or any drainage.  No open lesions.  Objective: AAO x3, NAD DP/PT pulses palpable bilaterally, CRT less than 3 seconds Nails are hypertrophic, dystrophic, brittle, discolored, elongated 10. In particular the 2nd digit nail on the right is the most incurvated, almost a pincher nail deformity. No surrounding redness or drainage. Tenderness nails 1-5 bilaterally. No pain with calf compression, swelling, warmth, erythema  Assessment: Symptomatic onychomycosis, pincer nail deformity   Plan: -All treatment options discussed with the patient including all alternatives, risks, complications.  -Sharp debrided nails x 10 without any complications or bleeding.  Continue topical antifungal medication. -Patient encouraged to call the office with any questions, concerns, change in symptoms.   Return in about 2 months (around 04/15/2024).  Donnice JONELLE Fees DPM

## 2024-02-18 DIAGNOSIS — L821 Other seborrheic keratosis: Secondary | ICD-10-CM | POA: Diagnosis not present

## 2024-02-18 DIAGNOSIS — L501 Idiopathic urticaria: Secondary | ICD-10-CM | POA: Diagnosis not present

## 2024-03-09 DIAGNOSIS — H4322 Crystalline deposits in vitreous body, left eye: Secondary | ICD-10-CM | POA: Diagnosis not present

## 2024-03-09 DIAGNOSIS — H353132 Nonexudative age-related macular degeneration, bilateral, intermediate dry stage: Secondary | ICD-10-CM | POA: Diagnosis not present

## 2024-03-09 DIAGNOSIS — Z961 Presence of intraocular lens: Secondary | ICD-10-CM | POA: Diagnosis not present

## 2024-03-09 DIAGNOSIS — H401122 Primary open-angle glaucoma, left eye, moderate stage: Secondary | ICD-10-CM | POA: Diagnosis not present

## 2024-03-09 DIAGNOSIS — H3581 Retinal edema: Secondary | ICD-10-CM | POA: Diagnosis not present

## 2024-03-09 DIAGNOSIS — H401113 Primary open-angle glaucoma, right eye, severe stage: Secondary | ICD-10-CM | POA: Diagnosis not present

## 2024-04-19 ENCOUNTER — Ambulatory Visit: Admitting: Podiatry

## 2024-04-19 DIAGNOSIS — B351 Tinea unguium: Secondary | ICD-10-CM

## 2024-04-19 DIAGNOSIS — M79674 Pain in right toe(s): Secondary | ICD-10-CM | POA: Diagnosis not present

## 2024-04-19 DIAGNOSIS — M79675 Pain in left toe(s): Secondary | ICD-10-CM | POA: Diagnosis not present

## 2024-04-19 NOTE — Progress Notes (Signed)
 Subjective: Chief Complaint  Patient presents with   Nail Problem    Nail trim      83 year old female presents the office with above concerns.  Presents today for concerns of her nails becoming thick and discolored she has difficulty trimming them they cause discomfort.  No other concerns today.   Objective: AAO x3, NAD DP/PT pulses palpable bilaterally, CRT less than 3 seconds Nails are hypertrophic, dystrophic, brittle, discolored, elongated 10. In particular the 2nd digit nail on the right is the most incurvated, almost a pincher nail deformity. No surrounding redness or drainage. Tenderness nails 1-5 bilaterally. No pain with calf compression, swelling, warmth, erythema  Assessment: Symptomatic onychomycosis, pincer nail deformity   Plan: -All treatment options discussed with the patient including all alternatives, risks, complications.  -Sharp debrided nails x 10 without any complications or bleeding.  Continue topical antifungal medication. -Patient encouraged to call the office with any questions, concerns, change in symptoms.   Return in about 3 months (around 07/18/2024).  Donnice JONELLE Fees DPM

## 2024-07-19 ENCOUNTER — Ambulatory Visit: Admitting: Podiatry
